# Patient Record
Sex: Male | Born: 1965 | Race: White | Hispanic: No | Marital: Married | Smoking: Current every day smoker
Health system: Southern US, Community
[De-identification: ages and names within clinical notes are randomized; demographics above are authoritative.]

## PROBLEM LIST (undated history)

## (undated) DIAGNOSIS — F329 Major depressive disorder, single episode, unspecified: Secondary | ICD-10-CM

## (undated) DIAGNOSIS — F32A Depression, unspecified: Secondary | ICD-10-CM

## (undated) DIAGNOSIS — G4730 Sleep apnea, unspecified: Secondary | ICD-10-CM

## (undated) HISTORY — DX: Depression, unspecified: F32.A

## (undated) HISTORY — PX: WISDOM TOOTH EXTRACTION: SHX21

## (undated) HISTORY — DX: Major depressive disorder, single episode, unspecified: F32.9

## (undated) HISTORY — PX: TONSILLECTOMY: SUR1361

## (undated) HISTORY — DX: Sleep apnea, unspecified: G47.30

---

## 1997-11-21 ENCOUNTER — Emergency Department (HOSPITAL_COMMUNITY): Admission: EM | Admit: 1997-11-21 | Discharge: 1997-11-21 | Payer: Self-pay | Admitting: Emergency Medicine

## 1998-01-01 ENCOUNTER — Encounter: Admission: RE | Admit: 1998-01-01 | Discharge: 1998-04-01 | Payer: Self-pay | Admitting: Specialist

## 1998-04-08 ENCOUNTER — Encounter: Admission: RE | Admit: 1998-04-08 | Discharge: 1998-07-07 | Payer: Self-pay | Admitting: Specialist

## 1998-10-14 ENCOUNTER — Ambulatory Visit: Admission: RE | Admit: 1998-10-14 | Discharge: 1998-10-14 | Payer: Self-pay | Admitting: *Deleted

## 2004-04-28 ENCOUNTER — Ambulatory Visit: Payer: Self-pay | Admitting: Pulmonary Disease

## 2005-12-22 ENCOUNTER — Encounter: Payer: Self-pay | Admitting: Gastroenterology

## 2009-11-26 ENCOUNTER — Encounter (INDEPENDENT_AMBULATORY_CARE_PROVIDER_SITE_OTHER): Payer: Self-pay | Admitting: *Deleted

## 2009-12-07 ENCOUNTER — Ambulatory Visit: Payer: Self-pay | Admitting: Gastroenterology

## 2009-12-07 DIAGNOSIS — Z860100 Personal history of colon polyps, unspecified: Secondary | ICD-10-CM | POA: Insufficient documentation

## 2009-12-07 DIAGNOSIS — G4733 Obstructive sleep apnea (adult) (pediatric): Secondary | ICD-10-CM | POA: Insufficient documentation

## 2010-01-07 ENCOUNTER — Ambulatory Visit (HOSPITAL_COMMUNITY): Admission: RE | Admit: 2010-01-07 | Discharge: 2010-01-07 | Payer: Self-pay | Admitting: Gastroenterology

## 2010-01-07 ENCOUNTER — Ambulatory Visit: Payer: Self-pay | Admitting: Gastroenterology

## 2010-02-21 ENCOUNTER — Ambulatory Visit: Payer: Self-pay | Admitting: Gastroenterology

## 2010-02-22 ENCOUNTER — Telehealth: Payer: Self-pay | Admitting: Gastroenterology

## 2010-05-31 NOTE — Assessment & Plan Note (Signed)
Summary: UNCONTROLLED LOOSE STOOLS WITH BLOOD...AS.   History of Present Illness Visit Type: new patient  Primary GI MD: Melvia Heaps MD [facility] Primary Provider: Benedetto Goad, MD  Requesting Provider: na Chief Complaint: Bloating, BRB in stool and when patient wipes after BMs, rectal pain, and hemorrhoids  History of Present Illness:   Mr. Stephen Sims is a pleasant 45 year old white male referred at the request of Dr. Andrey Campanile for evaluation of rectal bleeding.  For years he has had spontaneous rectal bleeding staining his his underclothes and bleeding associated with bowel movements.  He has had rectal soreness at times.  He apparantly underwent colonoscopy 2 years ago. No polyps were seen and hemorrhoids were identified.  He has taken various topical hemorrhoid medications and suppositories.  Hemorrhoids  will occasionally protrude with a bowel movement.  He moves his bowels regularly.   GI Review of Systems    Reports bloating.      Denies abdominal pain, acid reflux, belching, chest pain, dysphagia with liquids, dysphagia with solids, heartburn, loss of appetite, nausea, vomiting, vomiting blood, weight loss, and  weight gain.      Reports hemorrhoids, rectal bleeding, and  rectal pain.     Denies anal fissure, black tarry stools, change in bowel habit, constipation, diarrhea, diverticulosis, fecal incontinence, heme positive stool, irritable bowel syndrome, jaundice, light color stool, and  liver problems.    Current Medications (verified): 1)  Effexor Xr 150 Mg Xr24h-Cap (Venlafaxine Hcl) .... One Capsule By Mouth Once Daily 2)  Goodys Body Pain 500-325 Mg Pack (Aspirin-Acetaminophen) .... As Needed  Allergies (verified): 1)  ! Pcn 2)  ! Erythromycin 3)  ! Codeine  Past History:  Past Medical History: sleep apnea Anxiety Disorder Arthritis Colon Polyps Depression  Past Surgical History: Reviewed history from 12/02/2009 and no changes required. 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  Alcohol Use - yes: 2 beers daily  Daily Caffeine Use: 3 cups of coffee  Illicit Drug Use - no Smoking Status:  current Drug Use:  no  Review of Systems       The patient complains of anxiety-new, change in vision, depression-new, and fatigue.  The patient denies allergy/sinus, anemia, arthritis/joint pain, back pain, blood in urine, breast changes/lumps, confusion, cough, coughing up blood, fainting, fever, headaches-new, hearing problems, heart murmur, heart rhythm changes, itching, menstrual pain, muscle pains/cramps, night sweats, nosebleeds, pregnancy symptoms, shortness of breath, skin rash, sleeping problems, sore throat, swelling of feet/legs, swollen lymph glands, thirst - excessive , urination - excessive , urination changes/pain, urine leakage, vision changes, and voice change.         All other systems were reviewed and were negative   Vital Signs:  Patient profile:   45 year old male Height:      66 inches Weight:      183 pounds BMI:     29.64 BSA:     1.93 Pulse rate:   64 / minute Pulse rhythm:   regular BP sitting:   122 / 76  (left arm) Cuff size:   regular  Vitals Entered By: Ok Anis CMA (December 07, 2009 11:23 AM)  Physical Exam  Additional Exam:  On physical exam he is a well-developed well-nourished male  skin: anicteric HEENT: normocephalic; PEERLA; no nasal or pharyngeal abnormalities  neck: supple nodes: no cervical lymphadenopathy chest: clear to ausculatation and percussion heart: no murmurs, gallops, or rubs abd: soft, nontender; BS normoactive; no abdominal masses, tenderness, organomegaly rectal: no masses ext: no cynanosis, clubbing, edema skeletal: no deformities neuro: oriented x 3; no focal  abnormalities    Impression & Recommendations:  Problem # 1:  INTERNAL HEMORRHOIDS WITHOUT MENTION COMP (ICD-455.0)  Pt has grade 3 hemorrhoids.  Recommendations #1 I discussed various options including surgical hemorrhoidectomy and band  ligation.  At this time the patient wishes to try the latter.  Risks, alternatives, and complications of the procedure, including bleeding, perforation, and possible need for surgery, were explained to the patient.  Patient's questions were answered.  Orders: ZCOL Banding (ZCOL Band)  Problem # 2:  PERSONAL HISTORY OF COLONIC POLYPS (ICD-V12.72)  I will review his prior records.  Orders: ZCOL Banding (ZCOL Band)  Problem # 3:  HEMORRHAGE OF RECTUM AND ANUS (ICD-569.3)  Tthis most likely is due to  hemorrhoids.  At the patient's request I will do a full colonoscopy to rule out recurrent polyps and a more proximal colonic bleeding source.  Orders: ZCOL Banding (ZCOL Band)  Patient Instructions: 1)  Copy sent to : Benedetto Goad, MD  2)  Colonoscopy and Flexible Sigmoidoscopy brochure given.  3)  Conscious Sedation brochure given.  4)  Hemorrhoids brochure given.  5)  Your colonoscopy is scheduled at [facility] on 01/07/2010 at 12:30pm 6)  You can pick up your MoviPrep from your pharmacy today 7)  The medication list was reviewed and reconciled.  All changed / newly prescribed medications were explained.  A complete medication list was provided to the patient / caregiver. Prescriptions: MOVIPREP 100 GM  SOLR (PEG-KCL-NACL-NASULF-NA ASC-C) As per prep instructions.  #1 x 0   Entered by:   Merri Ray CMA (AAMA)   Authorized by:   Louis Meckel MD   Signed by:   Merri Ray CMA (AAMA) on 12/07/2009   Method used:   Electronically to        [facility] (retail)       [address]       [place], [place]  [address]       Ph: [phone]       Fax: [phone]   RxID:   [id number]

## 2010-05-31 NOTE — Procedures (Signed)
Summary: Instructions for procedure/[place]  Instructions for procedure/[place]   Imported By: Sherian Rein 12/10/2009 09:56:26  _____________________________________________________________________  External Attachment:    Type:   Image     Comment:   External Document

## 2010-05-31 NOTE — Procedures (Signed)
Summary: Colonoscopy/[facility]  Colonoscopy/[facility]   Imported By: Sherian Rein 03/04/2010 09:18:39  _____________________________________________________________________  External Attachment:    Type:   Image     Comment:   External Document

## 2010-05-31 NOTE — Assessment & Plan Note (Signed)
Summary: PROCEDURE F/U.Marland KitchenMarland KitchenAS.   History of Present Illness Visit Type: Follow-up Visit Primary GI MD: Melvia Heaps MD [facility] Primary Pranathi Winfree: Benedetto Goad, MD  Requesting Sholom Dulude: na Chief Complaint: F/u from flex sig.  Pt states that he is much better and denies any GI complaints  History of Present Illness:   Mr. Holtman has returned following band ligation of his hemorrhoids.  He reports complete resolution of his hemorrhoidal symptoms including leakage, discomfort and bleeding.  He currently has no GI complaints.   GI Review of Systems      Denies abdominal pain, acid reflux, belching, bloating, chest pain, dysphagia with liquids, dysphagia with solids, heartburn, loss of appetite, nausea, vomiting, vomiting blood, weight loss, and  weight gain.        Denies anal fissure, black tarry stools, change in bowel habit, constipation, diarrhea, diverticulosis, fecal incontinence, heme positive stool, hemorrhoids, irritable bowel syndrome, jaundice, light color stool, liver problems, rectal bleeding, and  rectal pain.    Current Medications (verified): 1)  Effexor Xr 150 Mg Xr24h-Cap (Venlafaxine Hcl) .... One Capsule By Mouth Once Daily 2)  Goodys Body Pain 500-325 Mg Pack (Aspirin-Acetaminophen) .... As Needed  Allergies (verified): 1)  ! Pcn 2)  ! Erythromycin 3)  ! Codeine  Past History:  Past Medical History: sleep apnea Anxiety Disorder Arthritis Colon Polyps Depression Hemorrhoids--banding   Past Surgical History: Reviewed history from 12/02/2009 and no changes required. Tonsillectomy  Family History: Reviewed history from 12/07/2009 and no changes required. 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  Alcohol Use - yes: 2  beers daily  Daily Caffeine Use: 3 cups of coffee  Illicit Drug Use - no  Review of Systems  The patient denies allergy/sinus, anemia, anxiety-new, arthritis/joint pain, back pain, blood in urine, breast changes/lumps, change in vision, confusion, cough, coughing up blood, depression-new, fainting, fatigue, fever, headaches-new, hearing problems, heart murmur, heart rhythm changes, itching, menstrual pain, muscle pains/cramps, night sweats, nosebleeds, pregnancy symptoms, shortness of breath, skin rash, sleeping problems, sore throat, swelling of feet/legs, swollen lymph glands, thirst - excessive , urination - excessive , urination changes/pain, urine leakage, vision changes, and voice change.    Vital Signs:  Patient profile:   45 year old male Height:      66 inches Weight:      182 pounds BMI:     29.48 BSA:     1.92 Pulse rate:   64 / minute Pulse rhythm:   regular BP sitting:   124 / 76  (left arm) Cuff size:   regular  Vitals Entered By: Ok Anis CMA (February 21, 2010 9:24 AM)   Impression & Recommendations:  Problem # 1:  INTERNAL HEMORRHOIDS WITHOUT MENTION COMP (ICD-455.0) Assessment Improved status post band ligation.  Problem # 2:  PERSONAL HISTORY OF COLONIC POLYPS (ICD-V12.72)  I will review his prior records.  Problem # 3:  HEMORRHAGE OF RECTUM AND ANUS (ICD-569.3) Assessment: Improved  Patient Instructions: 1)  Copy sent to : Benedetto Goad, MD  2)  The medication list was reviewed and reconciled.  All changed / newly prescribed medications were explained.  A complete medication list was provided to the patient / caregiver.

## 2010-05-31 NOTE — Letter (Signed)
Summary: [facility] Instructions  [facility]  [address] [place], [place] [address]   Phone: [phone]  Fax: [phone]       Stephen Sims    [birth year]    MRN: [id number]        Procedure Day /Date:FRIDAY 01/07/2010     Arrival Time:11:30AM     Procedure Time:12:30PM     Location of Procedure:                     X  [facility] ( Outpatient Registration)                        PREPARATION FOR COLONOSCOPY WITH MOVIPREP   Starting 5 days prior to your procedure 01/02/2010 do not eat nuts, seeds, popcorn, corn, beans, peas,  salads, or any raw vegetables.  Do not take any fiber supplements (e.g. Metamucil, Citrucel, and Benefiber).  THE DAY BEFORE YOUR PROCEDURE         DATE: 01/07/2010  DAY: FRIDAY   1.  Drink clear liquids the entire day-NO SOLID FOOD  2.  Do not drink anything colored red or purple.  Avoid juices with pulp.  No orange juice.  3.  Drink at least 64 oz. (8 glasses) of fluid/clear liquids during the day to prevent dehydration and help the prep work efficiently.  CLEAR LIQUIDS INCLUDE: Water Jello Ice Popsicles Tea (sugar ok, no milk/cream) Powdered fruit flavored drinks Coffee (sugar ok, no milk/cream) Gatorade Juice: apple, white grape, white cranberry  Lemonade Clear bullion, consomm, broth Carbonated beverages (any kind) Strained chicken noodle soup Hard Candy                             4.  In the morning, mix first dose of MoviPrep solution:    Empty 1 Pouch A and 1 Pouch B into the disposable container    Add lukewarm drinking water to the top line of the container. Mix to dissolve    Refrigerate (mixed solution should be used within 24 hrs)  5.  Begin drinking the prep at 5:00 p.m. The MoviPrep container is divided by 4 marks.   Every 15 minutes drink the solution down to the next Ganon (approximately 8 oz) until the full liter is complete.   6.  Follow completed prep with 16 oz of clear liquid of your choice  (Nothing red or purple).  Continue to drink clear liquids until bedtime.  7.  Before going to bed, mix second dose of MoviPrep solution:    Empty 1 Pouch A and 1 Pouch B into the disposable container    Add lukewarm drinking water to the top line of the container. Mix to dissolve    Refrigerate  THE DAY OF YOUR PROCEDURE      DATE: 01/07/2010 DAY: FRIDAY  Beginning at 7:30a.m. (5 hours before procedure):         1. Every 15 minutes, drink the solution down to the next Kharson (approx 8 oz) until the full liter is complete.  2. Follow completed prep with 16 oz. of clear liquid of your choice.    3. You may drink clear liquids until 8:30AM(4  HOURS BEFORE PROCEDURE).   MEDICATION INSTRUCTIONS  Unless otherwise instructed, you should take regular prescription medications with a small sip of water   as early as possible the morning of your procedure.  OTHER INSTRUCTIONS  You will need a responsible adult at least 47 years of age to accompany you and drive you home.   This person must remain in the waiting room during your procedure.  Wear loose fitting clothing that is easily removed.  Leave jewelry and other valuables at home.  However, you may wish to bring a book to read or  an iPod/MP3 player to listen to music as you wait for your procedure to start.  Remove all body piercing jewelry and leave at home.  Total time from sign-in until discharge is approximately 2-3 hours.  You should go home directly after your procedure and rest.  You can resume normal activities the  day after your procedure.  The day of your procedure you should not:   Drive   Make legal decisions   Operate machinery   Drink alcohol   Return to work  You will receive specific instructions about eating, activities and medications before you leave.    The above instructions have been reviewed and explained to me by   _______________________    I fully understand and can verbalize  these instructions _____________________________ Date _________

## 2010-05-31 NOTE — Letter (Signed)
Summary: New Patient letter  [facility]  [address] [place], [place] [address]   Phone: [phone]  Fax: [phone]       11/26/2009 MRN: [id number]  [name] [address] [name], [place]  [address]  Dear Stephen Sims,  Welcome to the Gastroenterology Division at [facility].    You are scheduled to see Dr. Arlyce Dice on 12/07/2009 at 11:00AM on the 3rd floor at [facility], [address]  We ask that you try to arrive at our office 15 minutes prior to your appointment time to allow for check-in.  We would like you to complete the enclosed self-administered evaluation form prior to your visit and bring it with you on the day of your appointment.  We will review it with you.  Also, please bring a complete list of all your medications or, if you prefer, bring the medication bottles and we will list them.  Please bring your insurance card so that we may make a copy of it.  If your insurance requires a referral to see a specialist, please bring your referral form from your primary care physician.  Co-payments are due at the time of your visit and may be paid by cash, check or credit card.     Your office visit will consist of a consult with your physician (includes a physical exam), any laboratory testing he/she may order, scheduling of any necessary diagnostic testing (e.g. x-ray, ultrasound, CT-scan), and scheduling of a procedure (e.g. Endoscopy, Colonoscopy) if required.  Please allow enough time on your schedule to allow for any/all of these possibilities.    If you cannot keep your appointment, please call [phone] to cancel or reschedule prior to your appointment date.  This allows [place] the opportunity to schedule an appointment for another patient in need of care.  If you do not cancel or reschedule by 5 p.m. the business day prior to your appointment date, you will be charged a $50.00 late cancellation/no-show fee.    Thank you for choosing  [facility] for your medical needs.  We appreciate the opportunity to care for you.  Please visit [place] at our website  to learn more about our practice.                     Sincerely,                                                             The Gastroenterology Division

## 2010-05-31 NOTE — Procedures (Signed)
Summary: Flexible Sigmoidoscopy  Patient: [name] Note: All result statuses are Final unless otherwise noted.  Tests: (1) Flexible Sigmoidoscopy (FLX)  FLX Flexible Sigmoidoscopy                             DONE     [facility]     [address] [place], [place]  [address]           FLEXIBLE SIGMOIDOSCOPY PROCEDURE REPORT           PATIENT:  Stephen Sims, Stephen Sims  MR#:  [id number]     BIRTHDATE:  02-Aug-1965, 44 yrs. old  GENDER:  male           ENDOSCOPIST:  Barbette Hair. Arlyce Dice, MD     Referred by:           PROCEDURE DATE:  01/07/2010     PROCEDURE:  Flexible Sigmoidoscopy with banding     ASA CLASS:  Class II     INDICATIONS:  treatment of hemorrhoids           MEDICATIONS:   Fentanyl 100 mcg IV, Versed 10 mg IV, Benadryl 50     mg IV           DESCRIPTION OF PROCEDURE:   After the risks benefits and     alternatives of the procedure were thoroughly explained, informed     consent was obtained.  Digital rectal exam was performed and     revealed no abnormalities.   The  endoscope was introduced through     the anus and advanced to the sigmoid colon, without limitations.     The quality of the prep was .  The instrument was then slowly     withdrawn as the mucosa was fully examined.           Internal hemorrhoids were found. hemorrhoidal banding 7 bands were     placed circumferentially just above the dentate line over 3     hemorrhoidal bundles   Retroflexion was not performed.  The scope     was then withdrawn from the patient and the procedure terminated.           COMPLICATIONS:  None           ENDOSCOPIC IMPRESSION:     1) Internal hemorrhoids - s/p band ligation     RECOMMENDATIONS:     1) Call the office to schedule a followup office visit for     _3_weeks           REPEAT EXAM:  No           ______________________________     Barbette Hair. Arlyce Dice, MD           CC:  Benedetto Goad, MD           n.     Rosalie Doctor:   Barbette Hair. Sunshine Mackowski at 01/07/2010 01:07  PM           Lehew, Anush, [place] [phone]  Note: An exclamation Naif (!) indicates a result that was not dispersed into the flowsheet. Document Creation Date: 01/07/2010 1:07 PM _______________________________________________________________________  (1) Order result status: Final Collection or observation date-time: 01/07/2010 13:04 Requested date-time:  Receipt date-time:  Reported date-time:  Referring Physician:   Ordering Physician: Melvia Heaps [phone]) Specimen Source:  Source: Launa Grill Order Number: [id number] Lab site:

## 2010-05-31 NOTE — Progress Notes (Signed)
Summary: Previous records  ---- Converted from flag ---- ---- 02/22/2010 2:25 PM, Louis Meckel MD wrote: needs colo 2017  ---- 02/22/2010 2:06 PM, Merri Ray CMA Duncan Dull) wrote: Elvina Sidle, Dr Julien Nordmann office found Marks colon report. I will put in your folder.. He had hyperplastic polyps in 11/2005. Repeat colon in 10 years.  ---- 02/22/2010 8:14 AM, Louis Meckel MD wrote: Ask pt how long ago his previous colo was.  If greater than 5 years he should have a repeat.  ---- 02/21/2010 4:52 PM, Merri Ray CMA (AAMA) wrote: I Contacted Dr Julien Nordmann office to get records concerning colon polyps. They stated they do not have any reports but what we have sent them.Which is the Flex. I tried to contact pt but phone number we have on file is not in service.  ---- 02/21/2010 9:50 AM, Louis Meckel MD wrote: please check Dr. Tawana Scale office for records regarding colon polyps in this patient. ------------------------------ PUT REMINDER RECALL COLONOSCOPY IN IDX FOR 11/30/2015  Appended Document: Previous records    Clinical Lists Changes  Observations: Added new observation of COLONNXTDUE: 11/30/2015 (02/22/2010 14:35)

## 2010-05-31 NOTE — Letter (Signed)
Summary: Results Letter  [facility]  [address] [place], [place] [address]   Phone: [phone]  Fax: [phone]        December 07, 2009 MRN: [id number]    [facility] [address] [place], [place]  [address]    Dear Mr. Rueth,  It is my pleasure to have treated you recently as a new patient in my office. I appreciate your confidence and the opportunity to participate in your care.  Since I do have a busy inpatient endoscopy schedule and office schedule, my office hours vary weekly. I am, however, available for emergency calls everyday through my office. If I am not available for an urgent office appointment, another one of our gastroenterologist will be able to assist you.  My well-trained staff are prepared to help you at all times. For emergencies after office hours, a physician from our Gastroenterology section is always available through my 24 hour answering service  Once again I welcome you as a new patient and I look forward to a happy and healthy relationship            Sincerely,  Louis Meckel MD  This letter has been electronically signed by your physician.  Appended Document: Results Letter LETTER MAILED

## 2013-02-17 ENCOUNTER — Ambulatory Visit: Payer: Self-pay | Admitting: Pulmonary Disease

## 2013-04-25 ENCOUNTER — Encounter: Payer: Self-pay | Admitting: Pulmonary Disease

## 2013-04-25 ENCOUNTER — Ambulatory Visit (INDEPENDENT_AMBULATORY_CARE_PROVIDER_SITE_OTHER): Payer: Managed Care, Other (non HMO) | Admitting: Pulmonary Disease

## 2013-04-25 ENCOUNTER — Encounter (INDEPENDENT_AMBULATORY_CARE_PROVIDER_SITE_OTHER): Payer: Self-pay

## 2013-04-25 VITALS — BP 130/82 | HR 77 | Temp 98.1°F | Ht 66.0 in | Wt 203.0 lb

## 2013-04-25 DIAGNOSIS — G4730 Sleep apnea, unspecified: Secondary | ICD-10-CM

## 2013-04-25 NOTE — Assessment & Plan Note (Signed)
The patient has a history of sleep apnea in the past, and has not been seen since 2005. He has not had a new machine since that time, and now is having issues with the machine making noise and having difficulties turning on.  He is also having breakthrough snoring and nonrestorative sleep, along with mild sleepiness during the day with inactivity. At this point, he is obviously going to need a new machine and mask, and will also take this opportunity to optimize his pressure again. I've encouraged him to work aggressively on weight loss, and he will need to followup with me on a yearly basis.

## 2013-04-25 NOTE — Progress Notes (Signed)
Subjective:    Patient ID: Stephen Sims, male    DOB: 1965-11-08, 47 y.o.   MRN: [id number]  HPI The patient is a 47 year old male who comes in today to reestablish for management of obstructive sleep apnea. He was diagnosed many years ago with sleep apnea, and has been on CPAP since that time. However, he has not been seen since 2005, and is using an aged CPAP machine and an old mask that is requiring duct tape to hold together. His machine is making noises, and is difficult to turn on at times. He is having breakthrough snoring according to his bed partner, and he is not rested in the mornings upon arising. He notes definite daytime sleepiness with inactivity. His Epworth score today is 14, and his weight is up significantly since his last visit.   Sleep Questionnaire What time do you typically go to bed?( Between what hours) 11p-1130p 11p-1130p at 0910 on 04/25/13 by Maisie Fus, CMA How long does it take you to fall asleep? 10-17mins 10-41mins at 0910 on 04/25/13 by Maisie Fus, CMA How many times during the night do you wake up? 0 0 at 0910 on 04/25/13 by Maisie Fus, CMA What time do you get out of bed to start your day? 0600 0600 at 0910 on 04/25/13 by Maisie Fus, CMA Do you drive or operate heavy machinery in your occupation? [employer] at [phone] on 04/25/13 by Maisie Fus, CMA How much has your weight changed (up or down) over the past two years? (In pounds) 8 lb (3.629 kg) 8 lb (3.629 kg) at 0910 on 04/25/13 by Maisie Fus, CMA Have you ever had a sleep study before? Yes Yes at 0910 on 04/25/13 by Maisie Fus, CMA If yes, [facility] 0910 on 04/25/13 by Maisie Fus, CMA If yes, date of study? 2005 2005 at 0910 on 04/25/13 by Maisie Fus, CMA Do you currently use CPAP? Yes Yes at 0910 on 04/25/13 by Maisie Fus, CMA If so, what pressure? 12? 12? at 0910 on 04/25/13 by Maisie Fus, CMA Do you wear  oxygen at any time? No    Review of Systems  Constitutional: Negative for fever and unexpected weight change.  HENT: Negative for congestion, dental problem, ear pain, nosebleeds, postnasal drip, rhinorrhea, sinus pressure, sneezing, sore throat and trouble swallowing.   Eyes: Negative for redness and itching.  Respiratory: Positive for shortness of breath. Negative for cough, chest tightness and wheezing.   Cardiovascular: Negative for palpitations and leg swelling.  Gastrointestinal: Negative for nausea and vomiting.  Genitourinary: Negative for dysuria.  Musculoskeletal: Negative for joint swelling.  Skin: Negative for rash.  Neurological: Negative for headaches.  Hematological: Does not bruise/bleed easily.  Psychiatric/Behavioral: Positive for dysphoric mood. The patient is not nervous/anxious.        Objective:   Physical Exam Constitutional:  Well developed, no acute distress  HENT:  Nares patent without discharge  Oropharynx without exudate, palate and uvula are normal  Eyes:  Perrla, eomi, no scleral icterus  Neck:  No JVD, no TMG  Cardiovascular:  Normal rate, regular rhythm, no rubs or gallops.  No murmurs        Intact distal pulses  Pulmonary :  Normal breath sounds, no stridor or respiratory distress   No rales, rhonchi, or wheezing  Abdominal:  Soft, nondistended, bowel sounds present.  No tenderness noted.   Musculoskeletal:  No  lower extremity edema noted.  Lymph Nodes:  No cervical lymphadenopathy noted  Skin:  No cyanosis noted  Neurologic:  Alert, appropriate, moves all 4 extremities without obvious deficit.         Assessment & Plan:

## 2013-04-25 NOTE — Patient Instructions (Signed)
Will get you a new cpap machine and mask, and will use the auto setting to re-calibrate your pressure.  Will let you know the results once we receive your download in 3-4 weeks. Work on weight loss followup with me in one year if doing well, but call if having issues.

## 2013-04-28 ENCOUNTER — Institutional Professional Consult (permissible substitution): Payer: Self-pay | Admitting: Pulmonary Disease

## 2014-04-27 ENCOUNTER — Ambulatory Visit: Payer: Self-pay | Admitting: Pulmonary Disease

## 2014-05-04 ENCOUNTER — Ambulatory Visit: Payer: Self-pay | Admitting: Pulmonary Disease

## 2015-08-18 ENCOUNTER — Ambulatory Visit (INDEPENDENT_AMBULATORY_CARE_PROVIDER_SITE_OTHER): Payer: 59 | Admitting: Pulmonary Disease

## 2015-08-18 ENCOUNTER — Encounter: Payer: Self-pay | Admitting: Pulmonary Disease

## 2015-08-18 ENCOUNTER — Ambulatory Visit (INDEPENDENT_AMBULATORY_CARE_PROVIDER_SITE_OTHER)
Admission: RE | Admit: 2015-08-18 | Discharge: 2015-08-18 | Disposition: A | Discharge status: Home / Self Care | Payer: 59 | Source: Ambulatory Visit | Attending: Pulmonary Disease | Admitting: Pulmonary Disease

## 2015-08-18 VITALS — BP 128/88 | HR 67 | Ht 66.0 in | Wt 208.0 lb

## 2015-08-18 DIAGNOSIS — R0609 Other forms of dyspnea: Secondary | ICD-10-CM | POA: Diagnosis not present

## 2015-08-18 DIAGNOSIS — Z720 Tobacco use: Secondary | ICD-10-CM | POA: Insufficient documentation

## 2015-08-18 DIAGNOSIS — R0600 Dyspnea, unspecified: Secondary | ICD-10-CM

## 2015-08-18 DIAGNOSIS — G4733 Obstructive sleep apnea (adult) (pediatric): Secondary | ICD-10-CM

## 2015-08-18 NOTE — Assessment & Plan Note (Signed)
Advised on smoking cessation. 

## 2015-08-18 NOTE — Assessment & Plan Note (Signed)
Pt with OSA. Unknown severity. Historically, CPAP has been doing well. Auto CPAP 5-20 cm water. Download the last month, 100% compliance, AHI 1.2. Has gained 30 pounds 6 years. Has a nasal mask. Has hypersomnia, snoring, dry mouth despite using CPAP. The issue is he breathes through his mouth with a nasal mask. Plan : 1. Switch to full face mask. If that doesn't work, patient will call and may need to get an ABG. Need to rule out obesity hypoventilation syndrome. Another option is modafinil but not sure if insurance will cover. 2. Sleep hygiene. 3. Cont  CPAP usage.

## 2015-08-18 NOTE — Progress Notes (Signed)
Subjective:    Patient ID: Stephen GainerMark A Sims, male    DOB: 01/23/1966, 50 y.o.   MRN: [id number]  HPI ROV (08/18/15) Pt returns to office as fd/u on his osa.  He uses his cpap. Feels better using it. More energy. Less sleepiness. Through the years, he feels cpap machine is not working as well as when he got it 2-3 yrs ago. Has snoring, hypersomnia. Hypersomnia affects fxnality. Sleepiness affects work. Has gained 5 lbs x 3 yrs and 30 lbs x 6 yrs. DL the last month 409%100%, AHI 1.2  Last seen in 03/2013. Pt has a nasla mask -- wakes up with dry mouth and has snoring. No new medical issues since last seen. trying to quit smoking.   Review of Systems  Constitutional: Negative.   HENT: Negative.   Eyes: Negative.   Respiratory: Positive for shortness of breath.   Cardiovascular: Negative.   Gastrointestinal: Negative.   Endocrine: Negative.   Genitourinary: Negative.   Musculoskeletal: Negative.   Skin: Negative.   Allergic/Immunologic: Negative.   Neurological: Negative.   Hematological: Negative.   Psychiatric/Behavioral: Negative.   All other systems reviewed and are negative.   Past Medical History  Diagnosis Date  . Sleep apnea   . Depression      Family History  Problem Relation Age of Onset  . Emphysema Father   . Heart disease Father   . COPD Father   . Atrial fibrillation Father   . Atrial fibrillation Mother      Past Surgical History  Procedure Laterality Date  . Tonsillectomy    . Wisdom tooth extraction      Social History   Social History  . Marital Status: Married    Spouse Name: N/A  . Number of Children: N/A  . Years of Education: N/A   Occupational History  . Supervisor (Maintenance)    Social History Main Topics  . Smoking status: Current Every Day Smoker -- 0.25 packs/day for 35 years    Types: Cigarettes  . Smokeless tobacco: Not on file  . Alcohol Use: 0.0 oz/week    0 Standard drinks or equivalent per week     Comment: 6pk per month  .  Drug Use: No  . Sexual Activity: Not on file   Other Topics Concern  . Not on file   Social History Narrative     Allergies  Allergen Reactions  . Codeine   . Erythromycin   . Penicillins      Outpatient Prescriptions Prior to Visit  Medication Sig Dispense Refill  . venlafaxine (EFFEXOR) 100 MG tablet Take 1 tablet by mouth daily.     No facility-administered medications prior to visit.   Meds ordered this encounter  Medications  . nicotine (NICODERM CQ - DOSED IN MG/24 HOURS) 21 mg/24hr patch    Sig: Place 21 mg onto the skin daily.           Objective:   Physical Exam  Vitals:  Filed Vitals:   08/18/15 1620  BP: 128/88  Pulse: 67  Height: 5\' 6"  (1.676 m)  Weight: 208 lb (94.348 kg)  SpO2: 98%    Constitutional/General:  Pleasant, well-nourished, well-developed, not in any distress,  Comfortably seating.  Well kempt obese.   Body mass index is 33.59 kg/(m^2). Wt Readings from Last 3 Encounters:  08/18/15 208 lb (94.348 kg)  04/25/13 203 lb (92.08 kg)  02/21/10 182 lb (82.555 kg)    Neck circumference: 19 inches  HEENT: Pupils  equal and reactive to light and accommodation. Anicteric sclerae. Normal nasal mucosa.   No oral  lesions,  mouth clear,  oropharynx clear, no postnasal drip. (-) Oral thrush. No dental caries.  Airway - Mallampati class III  Neck: No masses. Midline trachea. No JVD, (-) LAD. (-) bruits appreciated.  Respiratory/Chest: Grossly normal chest. (-) deformity. (-) Accessory muscle use.  Symmetric expansion. (-) Tenderness on palpation.  Resonant on percussion.  Diminished BS on both lower lung zones. (-) wheezing,rhonchi  Some crackles at bases.  (-) egophony  Cardiovascular: Regular rate and  rhythm, heart sounds normal, no murmur or gallops, no peripheral edema  Gastrointestinal:  Normal bowel sounds. Soft, non-tender. No hepatosplenomegaly.  (-) masses.   Musculoskeletal:  Normal muscle tone. Normal gait.    Extremities: Grossly normal. (-) clubbing, cyanosis.  (-) edema  Skin: (-) rash,lesions seen.   Neurological/Psychiatric : alert, oriented to time, place, person. Normal mood and affect            Assessment & Plan:  OSA (obstructive sleep apnea) Pt with OSA. Unknown severity. Historically, CPAP has been doing well. Auto CPAP 5-20 cm water. Download the last month, 100% compliance, AHI 1.2. Has gained 30 pounds 6 years. Has a nasal mask. Has hypersomnia, snoring, dry mouth despite using CPAP. The issue is he breathes through his mouth with a nasal mask. Plan : 1. Switch to full face mask. If that doesn't work, patient will call and may need to get an ABG. Need to rule out obesity hypoventilation syndrome. Another option is modafinil but not sure if insurance will cover. 2. Sleep hygiene. 3. Cont  CPAP usage.  Tobacco user Advised on smoking cessation.  Exertional dyspnea Smokes one pack per day. Likely has COPD. Likely mild. Both parents had COPD. Gets SOB, not too bad for now. Plan for chest x-ray today. We'll need PFTs once more symptomatic. Patient will call if dyspnea is worse.    Return to clinic in 1 yr.  [name], MD 08/18/2015, 4:50 PM [facility] Pager [phone] After 3 pm or if no answer, call [phone]

## 2015-08-18 NOTE — Assessment & Plan Note (Signed)
Smokes one pack per day. Likely has COPD. Likely mild. Both parents had COPD. Gets SOB, not too bad for now. Plan for chest x-ray today. We'll need PFTs once more symptomatic. Patient will call if dyspnea is worse.

## 2015-08-18 NOTE — Patient Instructions (Signed)
1. We will order you a full face mask. If that does not work, pls let us know. We will order abg then. 2. We will get you a chest X ray. 3. Let us know if you have more SOB.   Return to clinic in 1 yr

## 2015-09-06 ENCOUNTER — Encounter: Payer: Self-pay | Admitting: Pulmonary Disease

## 2015-11-29 ENCOUNTER — Encounter: Payer: Self-pay | Admitting: Gastroenterology

## 2017-03-11 IMAGING — DX DG CHEST 2V
2 series · 2 of 2 positions shown · non-contrast
Comparison: None.

CLINICAL DATA: Dyspnea on exertion.

EXAM:
CHEST  2 VIEW

[chest pa]
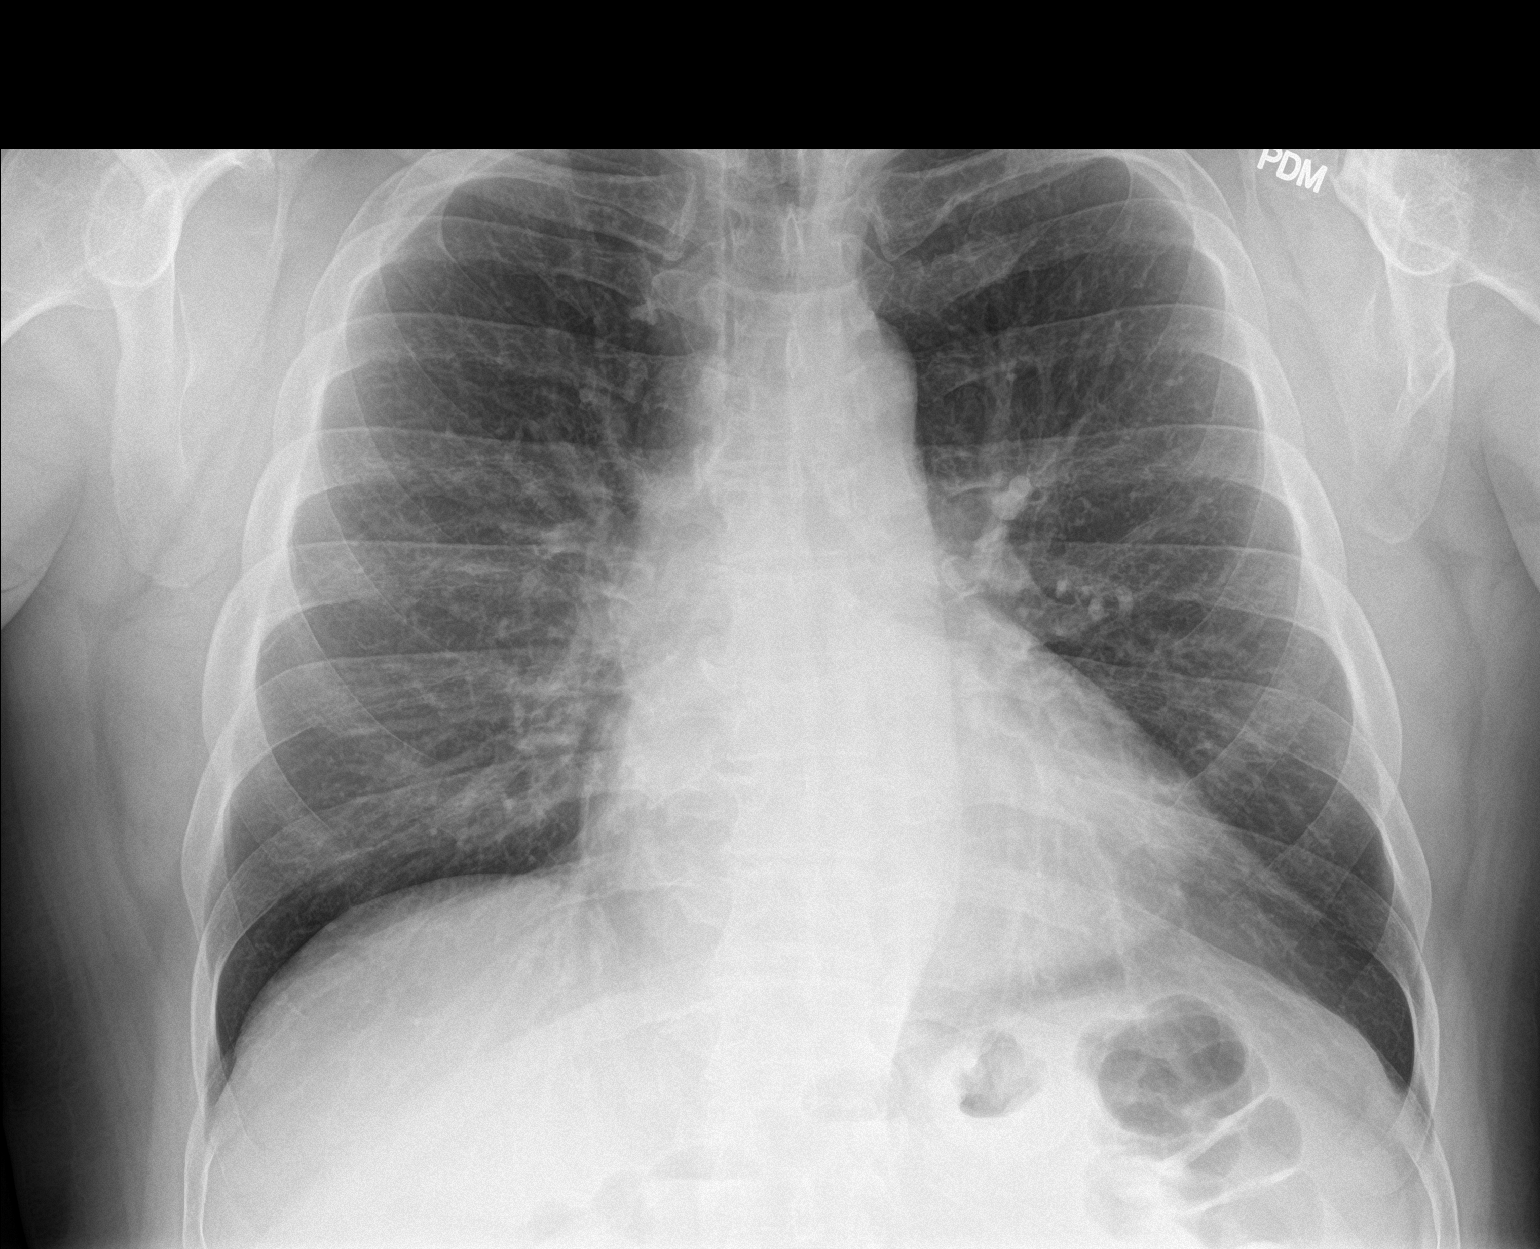

[chest lat]
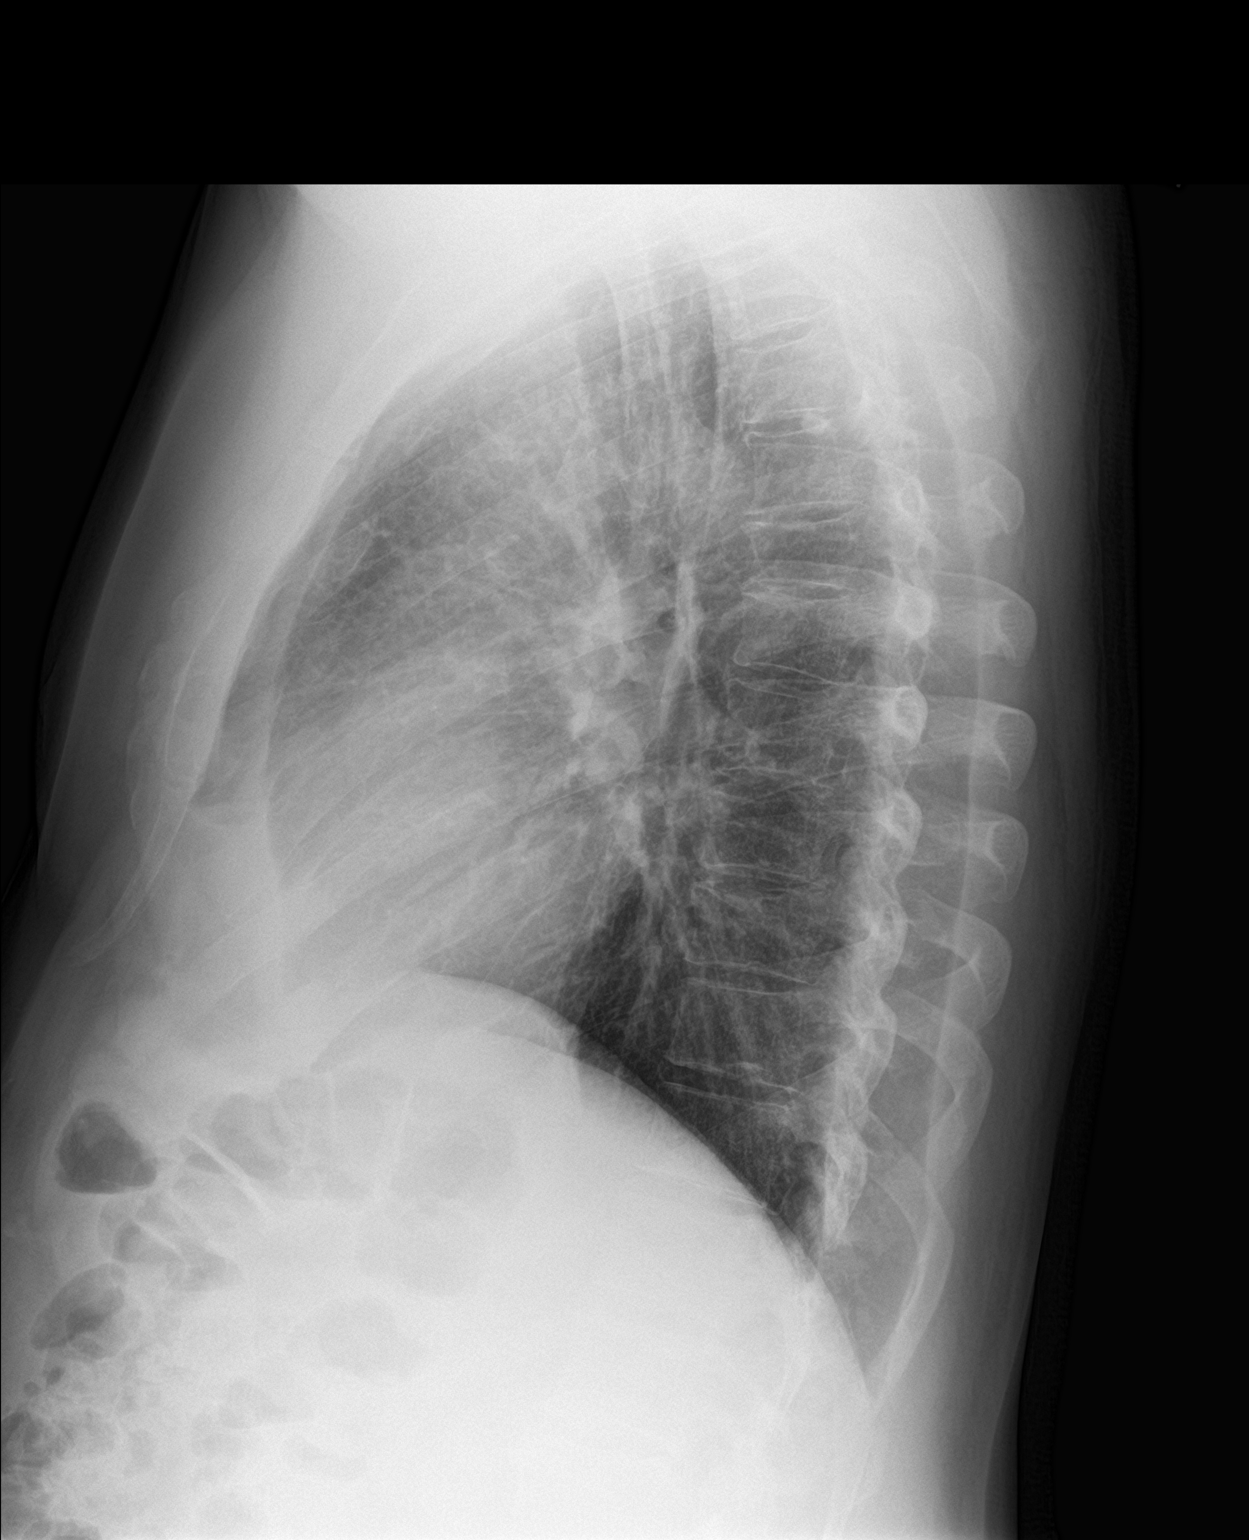

[2 of 2 positions shown; findings below may reference images not displayed]

FINDINGS: The heart size and mediastinal contours are within normal limits.
Both lungs are clear. The visualized skeletal structures are
unremarkable.
IMPRESSION: No active cardiopulmonary disease.

## 2017-08-27 ENCOUNTER — Ambulatory Visit: Payer: 59 | Admitting: Pulmonary Disease

## 2017-08-27 ENCOUNTER — Encounter: Payer: Self-pay | Admitting: Pulmonary Disease

## 2017-08-27 ENCOUNTER — Ambulatory Visit (INDEPENDENT_AMBULATORY_CARE_PROVIDER_SITE_OTHER)
Admission: RE | Admit: 2017-08-27 | Discharge: 2017-08-27 | Disposition: A | Discharge status: Home / Self Care | Payer: 59 | Source: Ambulatory Visit | Attending: Pulmonary Disease | Admitting: Pulmonary Disease

## 2017-08-27 VITALS — BP 142/90 | HR 62 | Ht 66.0 in | Wt 213.0 lb

## 2017-08-27 DIAGNOSIS — R0609 Other forms of dyspnea: Secondary | ICD-10-CM | POA: Diagnosis not present

## 2017-08-27 DIAGNOSIS — Z720 Tobacco use: Secondary | ICD-10-CM

## 2017-08-27 DIAGNOSIS — G4733 Obstructive sleep apnea (adult) (pediatric): Secondary | ICD-10-CM | POA: Diagnosis not present

## 2017-08-27 DIAGNOSIS — Z9989 Dependence on other enabling machines and devices: Secondary | ICD-10-CM

## 2017-08-27 NOTE — Progress Notes (Signed)
   Subjective:    Patient ID: Stephen Sims, male    DOB: [birth year], 52 y.o.   MRN: [id number]  HPI    Review of Systems  Constitutional: Negative for fever and unexpected weight change.  HENT: Negative for congestion, dental problem, ear pain, nosebleeds, postnasal drip, rhinorrhea, sinus pressure, sneezing, sore throat and trouble swallowing.   Eyes: Negative for redness and itching.  Respiratory: Positive for cough and shortness of breath. Negative for chest tightness and wheezing.   Cardiovascular: Negative for palpitations and leg swelling.  Gastrointestinal: Negative for nausea and vomiting.  Genitourinary: Negative for dysuria.  Musculoskeletal: Negative for joint swelling.  Skin: Negative for rash.  Allergic/Immunologic: Negative.  Negative for environmental allergies, food allergies and immunocompromised state.  Neurological: Negative for headaches.  Hematological: Does not bruise/bleed easily.  Psychiatric/Behavioral: Negative for dysphoric mood. The patient is not nervous/anxious.        Objective:   Physical Exam        Assessment & Plan:

## 2017-08-27 NOTE — Progress Notes (Signed)
Pulmonary, Critical Care, and Sleep Medicine  Chief Complaint  Patient presents with  . Sleep Apnea    former [facility] patient, CPAP- [facility]     Vital signs: BP (!) 142/90 (BP Location: Left Arm, Cuff Size: Normal)   Pulse 62   Ht  (1.676 m)   Wt 213 lb (96.6 kg)   SpO2 90%   BMI 34.38 kg/m   History of Present Illness: Stephen Sims is a 52 y.o. male smoker with dyspnea, and sleep apnea.  He was previously followed by Dr. Christene Slates, and Dr. Shelle Iron prior to this.  He uses CPAP nightly.  He has nasal mask.  Concern that he is still snoring.  Tried full face mask before, but didn't fit because of his beard.  Has noticed feeling more tired during the day.  His family also has noticed he gets jerking movements in his arms and legs while asleep.  He denies symptoms of restless legs.  He still smokes.  Tried chantix, but made him feel weird.  Tried wellbutrin, but no difference.  Also no difference from nicotine patch and gum.  Hasn't tried electronic cigarettes.  Has dry cough.  Gets winded with exertion.  No having sputum, hemoptysis, fever, swelling, chest pain.  His parents had heart disease and COPD.   Physical Exam:  General - pleasant Eyes - pupils reactive ENT - no sinus tenderness, no oral exudate, no LAN, MP 3 Cardiac - regular, no murmur Chest - no wheeze, rales Abd - soft, non tender Ext - no edema Skin - no rashes Neuro - normal strength Psych - normal mood   Assessment/Plan:  Dyspnea on exertion. - chest xray today - will schedule PFT  Tobacco abuse. - tried chantix and wellbutrin before w/o success - discussed electronic cigarettes  Obstructive sleep apnea. - he is compliant with CPAP and reports benefit - still has daytime sleepiness, and family concerned he is snoring while wearing CPAP - will arrange for chin strap to use with nasal mask - will arrange for ONO on CPAP - might need to change to different mask and might need in lab  titration  Periodic limb movements of sleep. - might need in lab titration to further assess   Patient Instructions  Chest xray today Will schedule overnight oxygen test and pulmonary function test Will have [facility] arrange for a chin strap for your CPAP mask  Follow up in 3 months    Coralyn Helling, MD [facility] Pulmonary/Critical Care 08/27/2017, 12:04 PM  Flow Sheet  Pulmonary tests:  Sleep tests: Auto CPAP 07/28/17 to 08/26/17 >> used on 30 of 30 nights with average 8 hrs 3 min.  Average AHI 1.4 with median CPAP 13 and 95 th percentile CPAP 18 cm H2O  Review of Systems: Constitutional: Negative for fever and unexpected weight change.  HENT: Negative for congestion, dental problem, ear pain, nosebleeds, postnasal drip, rhinorrhea, sinus pressure, sneezing, sore throat and trouble swallowing.   Eyes: Negative for redness and itching.  Respiratory: Positive for cough and shortness of breath. Negative for chest tightness and wheezing.   Cardiovascular: Negative for palpitations and leg swelling.  Gastrointestinal: Negative for nausea and vomiting.  Genitourinary: Negative for dysuria.  Musculoskeletal: Negative for joint swelling.  Skin: Negative for rash.  Allergic/Immunologic: Negative.  Negative for environmental allergies, food allergies and immunocompromised state.  Neurological: Negative for headaches.  Hematological: Does not bruise/bleed easily.  Psychiatric/Behavioral: Negative for dysphoric mood. The patient is not nervous/anxious.  Past Medical History: He  has a past medical history of Depression and Sleep apnea.  Past Surgical History: He  has a past surgical history that includes Tonsillectomy and Wisdom tooth extraction.  Family History: His family history includes Atrial fibrillation in his father and mother; COPD in his father; Emphysema in his father; Heart disease in his father.  Social History: He  reports that he has been smoking cigarettes.  He has a  52.50 pack-year smoking history. He has never used smokeless tobacco. He reports that he drinks alcohol. He reports that he does not use drugs.  Medications: Allergies as of 08/27/2017      Reactions   Codeine    Erythromycin    Penicillins       Medication List        Accurate as of 08/27/17 12:04 PM. Always use your most recent med list.          venlafaxine 100 MG tablet Commonly known as:  EFFEXOR Take 1 tablet by mouth daily.

## 2017-08-27 NOTE — Patient Instructions (Signed)
Chest xray today Will schedule overnight oxygen test and pulmonary function test Will have [facility] arrange for a chin strap for your CPAP mask  Follow up in 3 months

## 2017-08-28 ENCOUNTER — Telehealth: Payer: Self-pay | Admitting: Pulmonary Disease

## 2017-08-28 NOTE — Telephone Encounter (Signed)
Dg Chest 2 View  Result Date: 08/27/2017 CLINICAL DATA:  52 year old male with nonproductive cough and shortness of breath on exertion for 2 months. Smoker. EXAM: CHEST - 2 VIEW COMPARISON:  08/18/2015 chest radiographs. FINDINGS: Stable cardiac size at the upper limits of normal. Other mediastinal contours are within normal limits. Visualized tracheal air column is within normal limits. Lung volumes are stable and within normal limits. No pneumothorax, pulmonary edema, pleural effusion or confluent pulmonary opacity. No acute osseous abnormality identified. Negative visible bowel gas pattern. IMPRESSION: No acute cardiopulmonary abnormality. Electronically Signed   By: Odessa Fleming M.D.   On: 08/27/2017 17:08     Please leg him know his chest was normal.

## 2017-08-28 NOTE — Telephone Encounter (Signed)
Spoke with patient. He is aware of his results. Verbalized understanding. Nothing else needed at time of call.

## 2017-09-17 ENCOUNTER — Telehealth: Payer: Self-pay | Admitting: Pulmonary Disease

## 2017-09-17 NOTE — Telephone Encounter (Signed)
Called and spoke with patient regarding results.  Informed the patient of results and recommendations today. Pt verbalized understanding and denied any questions or concerns at this time.  Nothing further needed.  

## 2017-09-17 NOTE — Telephone Encounter (Signed)
ONO with CPAP 09/10/17 >> test time 9 hrs 1 min.  Basal SpO2 93.4%, low SpO2 88%.     Please let him know his overnight oxygen test looked good.  He doesn't need supplemental oxygen at night.

## 2020-08-04 ENCOUNTER — Other Ambulatory Visit: Payer: Self-pay

## 2020-08-04 ENCOUNTER — Ambulatory Visit: Payer: 59 | Admitting: Pulmonary Disease

## 2020-08-04 ENCOUNTER — Encounter: Payer: Self-pay | Admitting: Pulmonary Disease

## 2020-08-04 VITALS — BP 150/96 | HR 89 | Temp 97.9°F | Ht 66.0 in | Wt 219.0 lb

## 2020-08-04 DIAGNOSIS — Z720 Tobacco use: Secondary | ICD-10-CM

## 2020-08-04 DIAGNOSIS — G4733 Obstructive sleep apnea (adult) (pediatric): Secondary | ICD-10-CM | POA: Diagnosis not present

## 2020-08-04 DIAGNOSIS — R0602 Shortness of breath: Secondary | ICD-10-CM | POA: Diagnosis not present

## 2020-08-04 NOTE — Patient Instructions (Signed)
Will arrange for pulmonary function test  Will arrange for referral to [facility] to discuss lung cancer screening program  Will have [facility] change your CPAP setting to 5 - 16 cm water pressure  Follow up in 6 weeks

## 2020-08-04 NOTE — Progress Notes (Signed)
[facility], [facility]  Chief Complaint  Patient presents with  . Follow-up    Has some issues with cpap, shortness of breath with activity    Constitutional:  BP (!) 150/96 (BP Location: Left Arm, Cuff Size: Normal)   Pulse 89   Temp 97.9 F (36.6 C) (Temporal)   Ht 5\' 6"  (1.676 m)   Wt 219 lb (99.3 kg)   SpO2 97% Comment: Room air  BMI 35.35 kg/m   Past Medical History:  Depression  Past Surgical History:  He  has a past surgical history that includes Tonsillectomy and Wisdom tooth extraction.  Brief Summary:  Stephen Sims is a 55 y.o. male smoker with dyspnea and obstructive sleep apnea.      Subjective:   I last saw him in 2019.  He wasn't able to schedule PFT due to the pandemic.  He still smokes cigarettes, and down to 1 ppd now.  He gets winded with activities.  Has intermittent cough with clear sputum.  He uses CPAP nightly.  He has to clamp his mask down, and gets marks on his face in the morning.  His wife says he intermittently snores and kicks his legs.  He feels like his sleep is good.  Physical Exam:   Appearance - well kempt   ENMT - no sinus tenderness, no oral exudate, no LAN, Mallampati 4 airway, no stridor  Respiratory - equal breath sounds bilaterally, no wheezing or rales  CV - s1s2 regular rate and rhythm, no murmurs  Ext - no clubbing, no edema  Skin - no rashes  Psych - normal mood and affect   Pulmonary testing:    Chest Imaging:    Sleep Tests:   Auto CPAP 07/03/20 to 08/01/20 >> used on 30 of 30 nights with average 8 hrs 29 min.  Average AHI 1.3 with median CPAP 14 and 95 th percentile CPAP 19 cm H2O.  Air leak.  Cardiac Tests:    Social History:  He  reports that he has been smoking cigarettes. He has a 52.50 pack-year smoking history. He has never used smokeless tobacco. He reports current alcohol use. He reports that he does not use drugs.  Family History:  His family history  includes Atrial fibrillation in his father and mother; COPD in his father; Emphysema in his father; Heart disease in his father.     Assessment/Plan:   Dyspnea on exertion. - will arrange for pulmonary function test to assess for obstructive lung disease - if pulmonary assessment unrevealing, then he might need further cardiac assessment  Tobacco abuse. - tried chantix and wellbutrin before w/o success - will arrange for referral to 10/01/20 to discuss lung cancer screening program and options to help with smoking cessation  Obstructive sleep apnea. - he is compliant with CPAP and reports benefit - he uses [facility] for his DME - will change his auto CPAP to 5 - 16 cm H2O - if he is still having mask leak, then he would need mask refitting - if he still has leg kicking that starts to cause him trouble with sleep, then he would need a CPAP titration study; explained how periodic limb movements can be a benign phenomenon with clinical significance  Time Spent Involved in Patient Care on Day of Examination:  31 minutes  Follow up:  Patient Instructions  Will arrange for pulmonary function test  Will arrange for referral to [facility] to discuss lung cancer screening program  Will have [facility] change your CPAP setting to 5 - 16 cm water pressure  Follow up in 6 weeks   Medication List:   Allergies as of 08/04/2020      Reactions   Codeine    Erythromycin    Penicillins       Medication List       Accurate as of August 04, 2020 10:44 AM. If you have any questions, ask your nurse or doctor.        venlafaxine 100 MG tablet Commonly known as: EFFEXOR Take 1 tablet by mouth daily.       Signature:  Coralyn Helling, MD [facility] Pulmonary/Critical Care Pager - [phone] 08/04/2020, 10:44 AM

## 2020-08-09 ENCOUNTER — Telehealth: Payer: Self-pay | Admitting: Pulmonary Disease

## 2020-08-09 DIAGNOSIS — G4733 Obstructive sleep apnea (adult) (pediatric): Secondary | ICD-10-CM

## 2020-08-09 NOTE — Telephone Encounter (Signed)
Called and spoke with patient's wife. She stated that the patient's cpap machine displayed a message of "motor life has been exceeded". She is requesting to have an order sent to [facility] for a replacement cpap machine. Patient was last seen on 08/04/20 by VS. At that office, VS requested to have the pressure changed to auto cpap 5-16cm.   VS, please advise if you are ok with sending an order for a replacement cpap machine. Thanks!

## 2020-08-11 NOTE — Telephone Encounter (Signed)
VS please advise. Thanks! 

## 2020-08-16 NOTE — Telephone Encounter (Signed)
Okay to send order to arrange for new auto CPAP machine with pressure range of 5 to 16 cm H2O with heated humidity.

## 2020-08-16 NOTE — Telephone Encounter (Signed)
Called patient's wife but she did not answer. Left message for patient or patient's wife to call back.

## 2020-08-17 NOTE — Telephone Encounter (Signed)
Lmtcb for pt's wife, [name].

## 2020-08-17 NOTE — Telephone Encounter (Signed)
Pt's wife called back, advised that we would be ordering a new cpap for pt. Pt expressed understanding. cpap ordered. Nothing further needed at this time- will close encounter.

## 2020-09-17 ENCOUNTER — Other Ambulatory Visit (HOSPITAL_COMMUNITY): Payer: Managed Care, Other (non HMO)

## 2020-09-21 ENCOUNTER — Ambulatory Visit: Payer: Managed Care, Other (non HMO) | Admitting: Pulmonary Disease

## 2020-09-28 ENCOUNTER — Other Ambulatory Visit (HOSPITAL_COMMUNITY)
Admission: RE | Admit: 2020-09-28 | Discharge: 2020-09-28 | Disposition: A | Discharge status: Home / Self Care | Payer: Managed Care, Other (non HMO) | Source: Ambulatory Visit | Attending: Pulmonary Disease | Admitting: Pulmonary Disease

## 2020-09-28 DIAGNOSIS — Z20822 Contact with and (suspected) exposure to covid-19: Secondary | ICD-10-CM | POA: Insufficient documentation

## 2020-09-28 DIAGNOSIS — Z01812 Encounter for preprocedural laboratory examination: Secondary | ICD-10-CM | POA: Diagnosis present

## 2020-09-28 LAB — SARS CORONAVIRUS 2 (TAT 6-24 HRS): SARS Coronavirus 2: NEGATIVE

## 2020-09-29 ENCOUNTER — Encounter: Payer: Self-pay | Admitting: Adult Health

## 2020-09-29 ENCOUNTER — Other Ambulatory Visit: Payer: Self-pay

## 2020-09-29 ENCOUNTER — Ambulatory Visit (INDEPENDENT_AMBULATORY_CARE_PROVIDER_SITE_OTHER): Payer: Managed Care, Other (non HMO) | Admitting: Adult Health

## 2020-09-29 ENCOUNTER — Ambulatory Visit (INDEPENDENT_AMBULATORY_CARE_PROVIDER_SITE_OTHER): Payer: Managed Care, Other (non HMO) | Admitting: Pulmonary Disease

## 2020-09-29 DIAGNOSIS — G4733 Obstructive sleep apnea (adult) (pediatric): Secondary | ICD-10-CM | POA: Diagnosis not present

## 2020-09-29 DIAGNOSIS — Z720 Tobacco use: Secondary | ICD-10-CM

## 2020-09-29 DIAGNOSIS — R0600 Dyspnea, unspecified: Secondary | ICD-10-CM

## 2020-09-29 DIAGNOSIS — R0609 Other forms of dyspnea: Secondary | ICD-10-CM

## 2020-09-29 DIAGNOSIS — R0602 Shortness of breath: Secondary | ICD-10-CM

## 2020-09-29 LAB — PULMONARY FUNCTION TEST
DL/VA % pred: 122 %
DL/VA: 5.39 ml/min/mmHg/L
DLCO cor % pred: 103 %
DLCO cor: 25.35 ml/min/mmHg
DLCO unc % pred: 103 %
DLCO unc: 25.35 ml/min/mmHg
FEF 25-75 Post: 1.79 L/sec
FEF 25-75 Pre: 1.55 L/sec
FEF2575-%Change-Post: 15 %
FEF2575-%Pred-Post: 64 %
FEF2575-%Pred-Pre: 56 %
FEV1-%Change-Post: 1 %
FEV1-%Pred-Post: 72 %
FEV1-%Pred-Pre: 71 %
FEV1-Post: 2.3 L
FEV1-Pre: 2.27 L
FEV1FVC-%Change-Post: -1 %
FEV1FVC-%Pred-Pre: 98 %
FEV6-%Change-Post: 2 %
FEV6-%Pred-Post: 77 %
FEV6-%Pred-Pre: 75 %
FEV6-Post: 3.07 L
FEV6-Pre: 3 L
FEV6FVC-%Change-Post: 0 %
FEV6FVC-%Pred-Post: 103 %
FEV6FVC-%Pred-Pre: 103 %
FVC-%Change-Post: 2 %
FVC-%Pred-Post: 74 %
FVC-%Pred-Pre: 72 %
FVC-Post: 3.1 L
FVC-Pre: 3.03 L
Post FEV1/FVC ratio: 74 %
Post FEV6/FVC ratio: 99 %
Pre FEV1/FVC ratio: 75 %
Pre FEV6/FVC Ratio: 99 %
RV % pred: 59 %
RV: 1.13 L
TLC % pred: 70 %
TLC: 4.28 L

## 2020-09-29 MED ORDER — ALBUTEROL SULFATE HFA 108 (90 BASE) MCG/ACT IN AERS: 1.0000 | INHALATION_SPRAY | Freq: Four times a day (QID) | RESPIRATORY_TRACT | 2 refills | Status: DC | PRN

## 2020-09-29 NOTE — Progress Notes (Signed)
@Patient  ID: Stephen Sims, male    DOB: 1965/06/07, 55 y.o.   MRN: [id number]  Chief Complaint  Patient presents with  . Follow-up    Referring provider: [id number], MD  HPI: 55 year old male active smoker followed for obstructive sleep apnea and dyspnea  TEST/EVENTS :   09/29/2020 Follow up : OSA and Dyspnea  Patient returns for a 26-month follow-up.  Patient has underlying obstructive sleep apnea.  Patient says he wears his CPAP every night.  He says he cannot sleep without his CPAP machine.  Typically gets in about 6 to 7 hours each night.  Patient denies any significant daytime sleepiness.  Feels rested and feels that he benefits from CPAP.  Patient is an active smoker smokes about 1 pack a day.  Has a 52 pack year history.  Patient has tried Chantix and Wellbutrin in the past without significant benefit.  Patient has significant side effects with Chantix.  We discussed in detail helpful cessation tips. Patient has had some ongoing shortness of breath over the last few years.  He says he gets winded if he tries to go upstairs.  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  Says he has to go up steps quite frequently.  Does feel that he gets a little short of breath when he goes up.  Has minimum cough or wheezing.  Last chest x-ray was 2019 that showed no acute process.  Patient was set up for pulmonary function testing that showed mild restriction with an FEV1 at 72%, ratio 74, FVC 74%, no significant bronchodilator response.  DLCO was 103%. Patient denies any chest pain, palpitations, radiating pains, syncope.  He has no history of high blood pressure or hyperlipidemia.  Has no family history of coronary artery disease.  Patient says his father did have congestive heart failure later in life.     Allergies  Allergen Reactions  . Codeine   . Erythromycin   . Penicillins      There is no immunization history on file for this patient.  Past  Medical History:  Diagnosis Date  . Depression   . Sleep apnea     Tobacco History: Social History   Tobacco Use  Smoking Status Current Every Day Smoker  . Packs/day: 1.50  . Years: 35.00  . Pack years: 52.50  . Types: Cigarettes  Smokeless Tobacco Never Used  Tobacco Comment   smokes a little over a pack per day 09/29/20   Ready to quit: No Counseling given: Yes Comment: smokes a little over a pack per day 09/29/20   Outpatient Medications Prior to Visit  Medication Sig Dispense Refill  . venlafaxine (EFFEXOR) 100 MG tablet Take 1 tablet by mouth daily.     No facility-administered medications prior to visit.     Review of Systems:   Constitutional:   No  weight loss, night sweats,  Fevers, chills,  +fatigue, or  lassitude.  HEENT:   No headaches,  Difficulty swallowing,  Tooth/dental problems, or  Sore throat,                No sneezing, itching, ear ache, nasal congestion, post nasal drip,   CV:  No chest pain,  Orthopnea, PND, swelling in lower extremities, anasarca, dizziness, palpitations, syncope.   GI  No heartburn, indigestion, abdominal pain, nausea, vomiting, diarrhea, change in bowel habits, loss of appetite, bloody stools.   Resp:    No chest wall deformity  Skin:  no rash or lesions.  GU: no dysuria, change in color of urine, no urgency or frequency.  No flank pain, no hematuria   MS:  No joint pain or swelling.  No decreased range of motion.  No back pain.    Physical Exam  BP 130/84 (BP Location: Left Arm, Patient Position: Sitting, Cuff Size: Large)   Pulse 81   Temp 97.6 F (36.4 C) (Temporal)   Ht 5' 5.5" (1.664 m)   Wt 218 lb (98.9 kg)   SpO2 98%   BMI 35.73 kg/m   GEN: A/Ox3; pleasant , NAD, well nourished    HEENT:  [place]/AT,   NOSE-clear, THROAT-clear, no lesions, no postnasal drip or exudate noted. Class 2-3 MP airway   NECK:  Supple w/ fair ROM; no JVD; normal carotid impulses w/o bruits; no thyromegaly or nodules palpated; no  lymphadenopathy.    RESP  Clear  P & A; w/o, wheezes/ rales/ or rhonchi. no accessory muscle use, no dullness to percussion  CARD:  RRR, no m/r/g, no peripheral edema, pulses intact, no cyanosis or clubbing.  GI:   Soft & nt; nml bowel sounds; no organomegaly or masses detected.   Musco: Warm bil, no deformities or joint swelling noted.   Neuro: alert, no focal deficits noted.    Skin: Warm, no lesions or rashes    Lab Results:  CBC No results found for: WBC, RBC, HGB, HCT, PLT, MCV, MCH, MCHC, RDW, LYMPHSABS, MONOABS, EOSABS, BASOSABS  BMET No results found for: NA, K, CL, CO2, GLUCOSE, BUN, CREATININE, CALCIUM, GFRNONAA, GFRAA  BNP No results found for: BNP  ProBNP No results found for: PROBNP  Imaging: No results found.    PFT Results Latest Ref Rng & Units 09/29/2020  FVC-Pre L 3.03  FVC-Predicted Pre % 72  FVC-Post L 3.10  FVC-Predicted Post % 74  Pre FEV1/FVC % % 75  Post FEV1/FCV % % 74  FEV1-Pre L 2.27  FEV1-Predicted Pre % 71  FEV1-Post L 2.30  DLCO uncorrected ml/min/mmHg 25.35  DLCO UNC% % 103  DLCO corrected ml/min/mmHg 25.35  DLCO COR %Predicted % 103  DLVA Predicted % 122  TLC L 4.28  TLC % Predicted % 70  RV % Predicted % 59    No results found for: NITRICOXIDE      Assessment & Plan:   OSA (obstructive sleep apnea) Great compliance and perceived benefit   Plan  Patient Instructions  Albuterol Inhaler 1-2 puffs every 6 hr as needed.  Work on [employer] loss  Refer to Lung cancer screening program with Jefm Bryant NP  Work on not smoking .   Continue on CPAP At bedtime   Work on healthy weight .  Do not drive if sleepy  Follow up with Dr. Craige Cotta  In 4-6 months and As needed   Please contact office for sooner follow up if symptoms do not improve or worsen or seek emergency care          Exertional dyspnea Mild restriction on PFT ? RAD /Asthma . No significant airflow obstruction  Refer to LDCT program . Very heavy smoking hx  -cessation discussed  Trial of Albuterol  No chest pain , FH , risk factor other than smoking /obesity for CAD - hold on cards referral for now  Plan  Patient Instructions  Albuterol Inhaler 1-2 puffs every 6 hr as needed.  Work on [employer] loss  Refer to Lung cancer screening program with Jefm Bryant NP  Work on not  smoking .   Continue on CPAP At bedtime   Work on healthy weight .  Do not drive if sleepy  Follow up with Dr. Craige Cotta  In 4-6 months and As needed   Please contact office for sooner follow up if symptoms do not improve or worsen or seek emergency care        '  Tobacco user Smoking cessation  Refer to LDCT screening program      Rubye Oaks, NP 09/29/2020

## 2020-09-29 NOTE — Assessment & Plan Note (Signed)
Smoking cessation  Refer to LDCT screening program

## 2020-09-29 NOTE — Assessment & Plan Note (Signed)
Great compliance and perceived benefit   Plan  Patient Instructions  Albuterol Inhaler 1-2 puffs every 6 hr as needed.  Work on [employer] loss  Refer to Lung cancer screening program with Jefm Bryant NP  Work on not smoking .   Continue on CPAP At bedtime   Work on healthy weight .  Do not drive if sleepy  Follow up with Dr. Craige Cotta  In 4-6 months and As needed   Please contact office for sooner follow up if symptoms do not improve or worsen or seek emergency care

## 2020-09-29 NOTE — Assessment & Plan Note (Signed)
Mild restriction on PFT ? RAD /Asthma . No significant airflow obstruction  Refer to LDCT program . Very heavy smoking hx -cessation discussed  Trial of Albuterol  No chest pain , FH , risk factor other than smoking /obesity for CAD - hold on cards referral for now  Plan  Patient Instructions  Albuterol Inhaler 1-2 puffs every 6 hr as needed.  Work on [employer] loss  Refer to Lung cancer screening program with Jefm Bryant NP  Work on not smoking .   Continue on CPAP At bedtime   Work on healthy weight .  Do not drive if sleepy  Follow up with Dr. Craige Cotta  In 4-6 months and As needed   Please contact office for sooner follow up if symptoms do not improve or worsen or seek emergency care        '

## 2020-09-29 NOTE — Patient Instructions (Addendum)
Albuterol Inhaler 1-2 puffs every 6 hr as needed.  Work on [employer] loss  Refer to Lung cancer screening program with Jefm Bryant NP  Work on not smoking .   Continue on CPAP At bedtime   Work on healthy weight .  Do not drive if sleepy  Follow up with Dr. Craige Cotta  In 4-6 months and As needed   Please contact office for sooner follow up if symptoms do not improve or worsen or seek emergency care

## 2020-09-29 NOTE — Progress Notes (Signed)
PFT done today. 

## 2020-09-30 NOTE — Progress Notes (Signed)
Reviewed and agree with assessment/plan.   Aldair Rickel, MD [facility] 09/30/2020, 8:58 AM Pager:  [phone]  

## 2020-10-06 ENCOUNTER — Telehealth: Payer: Self-pay | Admitting: Pulmonary Disease

## 2020-10-06 NOTE — Telephone Encounter (Signed)
FYI: Due to multiple unsuccessful attempts to contact pt to schedule for lung cancer screening , this referral has been cancelled. Letter sent to pt to make him aware.

## 2021-09-07 ENCOUNTER — Encounter: Payer: Self-pay | Admitting: Nurse Practitioner

## 2021-09-07 ENCOUNTER — Ambulatory Visit: Payer: Managed Care, Other (non HMO) | Admitting: Nurse Practitioner

## 2021-09-07 VITALS — BP 120/80 | HR 92 | Temp 98.2°F | Ht 66.0 in | Wt 214.8 lb

## 2021-09-07 DIAGNOSIS — J189 Pneumonia, unspecified organism: Secondary | ICD-10-CM | POA: Diagnosis not present

## 2021-09-07 DIAGNOSIS — J432 Centrilobular emphysema: Secondary | ICD-10-CM

## 2021-09-07 DIAGNOSIS — R0609 Other forms of dyspnea: Secondary | ICD-10-CM

## 2021-09-07 DIAGNOSIS — Z720 Tobacco use: Secondary | ICD-10-CM | POA: Diagnosis not present

## 2021-09-07 DIAGNOSIS — G4733 Obstructive sleep apnea (adult) (pediatric): Secondary | ICD-10-CM

## 2021-09-07 MED ORDER — STIOLTO RESPIMAT 2.5-2.5 MCG/ACT IN AERS: 2.0000 | INHALATION_SPRAY | Freq: Every day | RESPIRATORY_TRACT | 0 refills | Status: DC

## 2021-09-07 NOTE — Assessment & Plan Note (Addendum)
Former heavy smoker. Has cut back recently but now vaping throughout the day. Mild centrilobular emphysema and mild BTX on LDCT chest from October. Did have a mild restriction on previous PFTs, which could be related to his weight or possible underlying ILD? If no improvement with Stiolto or if symptoms progress, we will do formal ILD workup.  ?

## 2021-09-07 NOTE — Patient Instructions (Addendum)
Trial Stiolto 2 puffs daily for 1 month ?Continue Albuterol inhaler 2 puffs every 6 hours as needed for shortness of breath or wheezing. Notify if symptoms persist despite rescue inhaler/neb use. ? ?Continue CPAP nightly minimum of 4-6 hours. Change pressure to 10-18 cmH2O. Contact DME about your humidification/condensation if this persists despite pressure change  ? ?Lung cancer screening program referral ? ?Smoking cessation referral ? ?Follow up in 4 weeks with CXR before with Dr. Craige Cotta or Philis Nettle. If symptoms do not improve or worsen, please contact office for sooner follow up or seek emergency care. ?

## 2021-09-07 NOTE — Assessment & Plan Note (Signed)
Excellent compliance. Breakthrough snoring. Residual AHI shows OSA well-controlled and without significant leaks. Based on his pressure use, we will adjust to 10-18 cmH2O and see if this helps. Advised he follow up with [employer] if he continues to experience issues with condensation for trouble shooting of his machine.  ?

## 2021-09-07 NOTE — Progress Notes (Signed)
Patient seen in the office today and instructed on use of stiolto.  Patient expressed understanding and demonstrated technique. 

## 2021-09-07 NOTE — Progress Notes (Signed)
? ?@Patient  ID: Stephen Sims, male    DOB: [birth year], 56 y.o.   MRN: [id number] ? ?Chief Complaint  ?Patient presents with  ? Follow-up  ?  Snoring with CPAP since had PNA (2 weeks)  getting worse.  Patient reports moisture in nasal mask.  Dry cough mostly.  Sometimes gets mucous up, unsure of color.  Using albuterol inhaler 3/day.    ? ? ?Referring provider: ?Christain Sacramento, MD ? ?HPI: ?56 year old male, active smoker followed for OSA on CPAP and DOE. He is a patient of Dr. Juanetta Gosling and last seen in office on 09/29/2020. Past medical history significant for depression. ? ?TEST/EVENTS:  ?09/29/2020 PFTs: FVC 74, FEV1 72, ratio 74, TLC 70, DLCOcor 103. Mild restrictive airway disease with normal diffusion capacity. No significant BD.  ?02/24/2021 LDCT chest (only read available and reviewed): benign calcified nodule in RUL; multiple scattered micronodules.There is mild centrilobular emphysema. Mild lower lobe BTX. No LAD. There is mild CAD.  ? ?09/29/2020: OV with Parrett,NP for follow up. Wearing CPAP nightly with good benefit. Active smoker - 1 ppd. Has tried chantix and wellbutrin in past without success and had significant side effects from Chantix. Reports DOE over the last several years. Completed PFTs which showed a mild restriction and normal DLCO. Recommended trial of albuterol. Referred to LDCT program.  ? ?09/07/2021: Today - follow up ?Patient presents today for follow up with his wife. He was treated around 3 weeks ago for pneumonia and then suspected bronchitis by his PCP. He completed abx around a week and a half ago. Reports that he has been feeling better since. Cough has returned to his baseline, which is productive, primarily in the mornings, and his DOE is about how it normally is. Gets winded with incline walking, especially stairs. He denies fevers, fatigue, weight loss, anorexia, or hemoptysis. No lower extremity swelling. Uses albuterol inhaler 3 times a day. He did undergo a LDCT through his PCP in  October. Results were reviewed today which showed mild centrilobular emphysema and some mild bronchiectasis. He did have a mild restriction on his previous PFTs; no formal obstruction. 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 He also does carpentry work as a hobby and has cats at home. He continues to smoke 1/4 pack a day but has now taken up vaping as he thought this was better than smoking. He has tried Chantix in the past with negative side effects, nicotene patches without much success and Wellbutrin without much success. ? ?Regarding his CPAP, he continues to wear it every night without fail and receive good benefit from it. His wife has noticed him snoring some around it recently. He has also noticed that in the morning, he wakes up with his face wet and condensation in his mask. Denies excessive daytime fatigue, morning headaches, drowsy driving or narcolepsy.  ? ?08/07/2021-09/05/2021 ?AirView Download CPAP 5-16 cmH2O ?30/30 days used; 100% >4 hours; av usage 9 hours 11 min ?Pressure median 12.4, 95th 15.7 ?Leaks median 0.4, 95th 14.6  ?AHI 1.9 ? ?Allergies  ?Allergen Reactions  ? Codeine   ? Erythromycin   ? Penicillins   ? ? ? ?There is no immunization history on file for this patient. ? ?Past Medical History:  ?Diagnosis Date  ? Depression   ? Sleep apnea   ? ? ?Tobacco History: ?Social History  ? ?Tobacco Use  ?Smoking Status Every Day  ? Packs/day: 1.50  ?  Years: 35.00  ? Pack years: 52.50  ? Types: Cigarettes  ?Smokeless Tobacco Never  ?Tobacco Comments  ? Smoking 5-6 cigarettes/day.  Vaping throughout the day.  09/07/2021 hfb  ? ?Ready to quit: Not Answered ?Counseling given: Not Answered ?Tobacco comments: Smoking 5-6 cigarettes/day.  Vaping throughout the day.  09/07/2021 hfb ? ? ?Outpatient Medications Prior to Visit  ?Medication Sig Dispense Refill  ? albuterol (VENTOLIN HFA) 108 (90 Base) MCG/ACT inhaler Inhale 1-2 puffs  into the lungs every 6 (six) hours as needed for wheezing or shortness of breath. 8 g 2  ? amLODipine (NORVASC) 5 MG tablet TAKE 1 TABLET BY MOUTH EVERYDAY AT BEDTIME    ? venlafaxine (EFFEXOR) 100 MG tablet Take 1 tablet by mouth daily.    ? ?No facility-administered medications prior to visit.  ? ? ? ?Review of Systems:  ? ?Constitutional: No weight loss or gain, night sweats, fevers, chills, fatigue, or lassitude. ?HEENT: No headaches, difficulty swallowing, tooth/dental problems, or sore throat. No sneezing, itching, ear ache, nasal congestion, or post nasal drip ?CV:  No chest pain, orthopnea, PND, swelling in lower extremities, anasarca, dizziness, palpitations, syncope ?Resp: +shortness of breath with exertion (mainly incline walking/climbing); chronic productive cough (baseline). No excess mucus or change in color of mucus. No hemoptysis. No wheezing.  No chest wall deformity ?GI:  No heartburn, indigestion, abdominal pain, nausea, vomiting, diarrhea, change in bowel habits, loss of appetite, bloody stools.  ?GU: No dysuria, change in color of urine, urgency or frequency.  No flank pain, no hematuria  ?Skin: No rash, lesions, ulcerations ?MSK:  No joint pain or swelling.  No decreased range of motion.  No back pain. ?Neuro: No dizziness or lightheadedness.  ?Psych: No depression or anxiety. Mood stable.  ? ? ? ?Physical Exam: ? ?BP 120/80 (BP Location: Right Arm, Patient Position: Sitting, Cuff Size: Large)   Pulse 92   Temp 98.2 ?F (36.8 ?C) (Oral)   Ht 5\' 6"  (1.676 m)   Wt 214 lb 12.8 oz (97.4 kg)   SpO2 97%   BMI 34.67 kg/m?  ? ?GEN: Pleasant, interactive, well-appearing; obese; in no acute distress. ?HEENT:  Normocephalic and atraumatic. PERRLA. Sclera white. Nasal turbinates pink, moist and patent bilaterally. No rhinorrhea present. Oropharynx pink and moist, without exudate or edema. No lesions, ulcerations, or postnasal drip.  ?NECK:  Supple w/ fair ROM. No JVD present. Normal carotid impulses  w/o bruits. Thyroid symmetrical with no goiter or nodules palpated. No lymphadenopathy.   ?CV: RRR, no m/r/g, no peripheral edema. Pulses intact, +2 bilaterally. No cyanosis, pallor or clubbing. ?PULMONARY:  Unlabored, regular breathing. Clear bilaterally A&P w/o wheezes/rales/rhonchi. No accessory muscle use. No dullness to percussion. ?GI: BS present and normoactive. Soft, non-tender to palpation. No organomegaly or masses detected. No CVA tenderness. ?MSK: No erythema, warmth or tenderness. Cap refil <2 sec all extrem. No deformities or joint swelling noted.  ?Neuro: A/Ox3. No focal deficits noted.   ?Skin: Warm, no lesions or rashe ?Psych: Normal affect and behavior. Judgement and thought content appropriate.  ? ? ? ?Lab Results: ? ?CBC ?No results found for: WBC, RBC, HGB, HCT, PLT, MCV, MCH, MCHC, RDW, LYMPHSABS, MONOABS, EOSABS, BASOSABS ? ?BMET ?No results found for: NA, K, CL, CO2, GLUCOSE, BUN, CREATININE, CALCIUM, GFRNONAA, GFRAA ? ?BNP ?No results found for: BNP ? ? ?Imaging: ? ?No results found. ? ? ? ? ?  Latest Ref Rng & Units 09/29/2020  ?  8:53 AM  ?PFT Results  ?FVC-Pre L  3.03    ?FVC-Predicted Pre % 72    ?FVC-Post L 3.10    ?FVC-Predicted Post % 74    ?Pre FEV1/FVC % % 75    ?Post FEV1/FCV % % 74    ?FEV1-Pre L 2.27    ?FEV1-Predicted Pre % 71    ?FEV1-Post L 2.30    ?DLCO uncorrected ml/min/mmHg 25.35    ?DLCO UNC% % 103    ?DLCO corrected ml/min/mmHg 25.35    ?DLCO COR %Predicted % 103    ?DLVA Predicted % 122    ?TLC L 4.28    ?TLC % Predicted % 70    ?RV % Predicted % 59    ? ? ?No results found for: NITRICOXIDE ? ? ? ? ? ?Assessment & Plan:  ? ?Centrilobular emphysema ([place]) ?Mild centrilobular emphysema on CT chest; does report good benefit from albuterol rescue. Advised trial of Stiolto - samples provided today.  ? ?Patient Instructions  ?Trial Stiolto 2 puffs daily for 1 month ?Continue Albuterol inhaler 2 puffs every 6 hours as needed for shortness of breath or wheezing. Notify if symptoms  persist despite rescue inhaler/neb use. ? ?Continue CPAP nightly minimum of 4-6 hours. Change pressure to 10-18 cmH2O. Contact DME about your humidification/condensation if this persists despite pressure chan

## 2021-09-07 NOTE — Assessment & Plan Note (Signed)
Mild centrilobular emphysema on CT chest; does report good benefit from albuterol rescue. Advised trial of Stiolto - samples provided today.  ? ?Patient Instructions  ?Trial Stiolto 2 puffs daily for 1 month ?Continue Albuterol inhaler 2 puffs every 6 hours as needed for shortness of breath or wheezing. Notify if symptoms persist despite rescue inhaler/neb use. ? ?Continue CPAP nightly minimum of 4-6 hours. Change pressure to 10-18 cmH2O. Contact DME about your humidification/condensation if this persists despite pressure change  ? ?Lung cancer screening program referral ? ?Smoking cessation referral ? ?Follow up in 4 weeks with CXR before with Dr. Craige Cotta or Philis Nettle. If symptoms do not improve or worsen, please contact office for sooner follow up or seek emergency care. ? ? ?

## 2021-09-07 NOTE — Assessment & Plan Note (Signed)
CAP diagnosed by PCP. Completed abx course 1.5 weeks ago. Clinically improved. Plans to repeat CXR at follow up in 4 weeks. Advised to notify if worsening productive cough, SOB, fatigue/malaise, or fevers develop.  ?

## 2021-09-07 NOTE — Progress Notes (Signed)
Reviewed and agree with assessment/plan. ? ? ?Kamauri Kathol, MD ?[facility] ?09/07/2021, 10:47 AM ?Pager:  [phone] ? ?

## 2021-09-07 NOTE — Assessment & Plan Note (Signed)
Smoking cessation strongly advised. Congratulated on cutting back; however, encouraged to avoid vaping as a replacement given the negative impacts this has on health. Has tried multiple things in the past with little success. Referred to smoking cessation program and LDCT program.  ?

## 2021-10-05 ENCOUNTER — Telehealth: Payer: Self-pay

## 2021-10-05 ENCOUNTER — Ambulatory Visit (INDEPENDENT_AMBULATORY_CARE_PROVIDER_SITE_OTHER): Payer: Managed Care, Other (non HMO)

## 2021-10-05 ENCOUNTER — Encounter: Payer: Self-pay | Admitting: Nurse Practitioner

## 2021-10-05 ENCOUNTER — Ambulatory Visit (INDEPENDENT_AMBULATORY_CARE_PROVIDER_SITE_OTHER): Payer: Managed Care, Other (non HMO) | Admitting: Nurse Practitioner

## 2021-10-05 VITALS — BP 132/78 | HR 73 | Temp 98.2°F | Ht 66.0 in | Wt 218.4 lb

## 2021-10-05 DIAGNOSIS — R0609 Other forms of dyspnea: Secondary | ICD-10-CM | POA: Diagnosis not present

## 2021-10-05 DIAGNOSIS — J189 Pneumonia, unspecified organism: Secondary | ICD-10-CM

## 2021-10-05 DIAGNOSIS — J432 Centrilobular emphysema: Secondary | ICD-10-CM

## 2021-10-05 DIAGNOSIS — G4733 Obstructive sleep apnea (adult) (pediatric): Secondary | ICD-10-CM

## 2021-10-05 DIAGNOSIS — Z720 Tobacco use: Secondary | ICD-10-CM

## 2021-10-05 MED ORDER — STIOLTO RESPIMAT 2.5-2.5 MCG/ACT IN AERS: 2.0000 | INHALATION_SPRAY | Freq: Every day | RESPIRATORY_TRACT | 5 refills | Status: DC

## 2021-10-05 NOTE — Progress Notes (Signed)
Reviewed and agree with assessment/plan.   Coralyn Helling, MD [facility] 10/05/2021, 9:40 AM Pager:  [phone]

## 2021-10-05 NOTE — Assessment & Plan Note (Signed)
Mild centrilobular emphysema on CT chest. Started on Stiolto at our last visit for DOE - does report good benefit from this. Will send rx today.  Patient Instructions  Continue Stiolto 2 puffs daily for 1 month Continue Albuterol inhaler 2 puffs every 6 hours as needed for shortness of breath or wheezing. Notify if symptoms persist despite rescue inhaler/neb use.   Continue CPAP nightly minimum of 4-6 hours. Change pressure to 10-18 cmH2O. Contact DME about your humidification/condensation if this persists despite pressure change. Let me know if you keep having despite change   Follow up in 3 months with Dr. Craige Cotta or Philis Nettle. If symptoms do not improve or worsen, please contact office for sooner follow up or seek emergency care.

## 2021-10-05 NOTE — Patient Instructions (Addendum)
Continue Stiolto 2 puffs daily for 1 month Continue Albuterol inhaler 2 puffs every 6 hours as needed for shortness of breath or wheezing. Notify if symptoms persist despite rescue inhaler/neb use.   Continue CPAP nightly minimum of 4-6 hours. Change pressure to 10-18 cmH2O. Contact DME about your humidification/condensation if this persists despite pressure change. Let me know if you keep having despite change   Follow up in 3 months with Dr. Craige Cotta or Philis Nettle. If symptoms do not improve or worsen, please contact office for sooner follow up or seek emergency care.

## 2021-10-05 NOTE — Assessment & Plan Note (Signed)
Smoking cessation strongly advised again. Suspect this is a major contributor to his daily cough. Has tried multiple things in the past with little success. Referred to smoking cessation program and LDCT program at previous visit.

## 2021-10-05 NOTE — Assessment & Plan Note (Signed)
Active smoker. Mild centrilobular emphysema and mild BTX on LDCT chest from October. Did have a mild restriction on previous PFTs, which could be related to his weight or possible underlying ILD? He has had improvement with Stiolto so shared decision to hold off on further ILD workup.

## 2021-10-05 NOTE — Assessment & Plan Note (Signed)
CAP diagnosed by PCP. Completed abx course almost 6 weeks ago. Clinically improved. Plans to repeat CXR today to ensure resolution.

## 2021-10-05 NOTE — Addendum Note (Signed)
Addended by: Gavin Potters R on: 10/05/2021 09:48 AM   Modules accepted: Orders

## 2021-10-05 NOTE — Progress Notes (Signed)
@Patient  ID: Stephen Sims, male    DOB: 02/12/1966, 56 y.o.   MRN: [id number]  Chief Complaint  Patient presents with   Follow-up    Snoring over machine, dry cough      Referring provider: Christain Sacramento, MD  HPI: 56 year old male, active smoker followed for OSA on CPAP and DOE. He is a patient of Dr. Juanetta Gosling and last seen in office on 09/29/2020. Past medical history significant for depression.  TEST/EVENTS:  09/29/2020 PFTs: FVC 74, FEV1 72, ratio 74, TLC 70, DLCOcor 103. Mild restrictive airway disease with normal diffusion capacity. No significant BD.  02/24/2021 LDCT chest (only read available and reviewed): benign calcified nodule in RUL; multiple scattered micronodules.There is mild centrilobular emphysema. Mild lower lobe BTX. No LAD. There is mild CAD.   09/29/2020: OV with Parrett,NP for follow up. Wearing CPAP nightly with good benefit. Active smoker - 1 ppd. Has tried chantix and wellbutrin in past without success and had significant side effects from Chantix. Reports DOE over the last several years. Completed PFTs which showed a mild restriction and normal DLCO. Recommended trial of albuterol. Referred to LDCT program.   09/07/2021: OV with Fayola Meckes NP for follow up with his wife. He was treated around 3 weeks ago for pneumonia and then suspected bronchitis by his PCP. He completed abx around a week and a half ago. Reports that he has been feeling better since. Cough has returned to his baseline, which is productive, primarily in the mornings, and his DOE is about how it normally is. Gets winded with incline walking, especially stairs.  Plan for repeat CXR in 4 weeks to ensure resolution. He did undergo a LDCT through his PCP in October. Results were reviewed today which showed mild centrilobular emphysema and some mild bronchiectasis. Trial Stiolto. He did have a mild restriction on his previous PFTs; no formal obstruction. Question weight vs possible ILD. 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 He also does carpentry work as a hobby and has cats at home. If no improvement with Stiolto, recommended further ILD workup. He continues to smoke 1/4 pack a day but has now taken up vaping as he thought this was better than smoking. He has tried Chantix in the past with negative side effects, nicotene patches without much success and Wellbutrin without much success. Referred to lung cancer screening program and smoking cessation program.   Regarding his CPAP, he continues to wear it every night without fail and receive good benefit from it. Wife reported snoring around the machine. His median pressures were around 12 cmH2O with max 15.7 so adjusted his settings to 10-18 cmH2O.  10/05/2021: Today - follow up Patient presents today with wife for follow up after being started on Stiolto. He reports feeling well. He has noticed a difference in his breathing with Stiolto and feels like his activity tolerance is better. He does continue to have a chronic cough which doesn't bother him much and is unchanged from his baseline. It is rarely productive with white sputum. He denies any wheezing, leg swelling, fevers, night sweats, hemoptysis, weight loss. He has not had to use his rescue inhaler recently. He is still smoking 1/4-1/2 pack per day and vaping occasionally.   They both report that he is still having issues with snoring around his CPAP. They don't think that his pressure settings ever were changed. He wears it nightly without fail.  09/05/2021-10/04/2021 AirView Download CPAP 5-16 cmH2O 30/30 days (100% >4 hours); av usage 8 hours 16 min Pressure median 12.7 cmH2O, max 15.8 Leaks median 0.4, max 25.6  AHI 2.8  Allergies  Allergen Reactions   Codeine    Erythromycin    Penicillins      There is no immunization history on file for this patient.  Past Medical History:  Diagnosis Date   Depression     Sleep apnea     Tobacco History: Social History   Tobacco Use  Smoking Status Every Day   Packs/day: 1.50   Years: 35.00   Pack years: 52.50   Types: Cigarettes  Smokeless Tobacco Never  Tobacco Comments   5 -  10 cigarettes smoked a day.  Vaping throughout the day.  10/05/21 ARJ    Ready to quit: Not Answered Counseling given: Not Answered Tobacco comments: 5 -  10 cigarettes smoked a day.  Vaping throughout the day.  10/05/21 ARJ    Outpatient Medications Prior to Visit  Medication Sig Dispense Refill   albuterol (VENTOLIN HFA) 108 (90 Base) MCG/ACT inhaler Inhale 1-2 puffs into the lungs every 6 (six) hours as needed for wheezing or shortness of breath. 8 g 2   amLODipine (NORVASC) 5 MG tablet TAKE 1 TABLET BY MOUTH EVERYDAY AT BEDTIME     venlafaxine (EFFEXOR) 100 MG tablet Take 1 tablet by mouth daily.     Tiotropium Bromide-Olodaterol (STIOLTO RESPIMAT) 2.5-2.5 MCG/ACT AERS Inhale 2 puffs into the lungs daily. 4 g 0   No facility-administered medications prior to visit.     Review of Systems:   Constitutional: No weight loss or gain, night sweats, fevers, chills, fatigue, or lassitude. HEENT: No headaches, difficulty swallowing, tooth/dental problems, or sore throat. No sneezing, itching, ear ache, nasal congestion, or post nasal drip CV:  No chest pain, orthopnea, PND, swelling in lower extremities, anasarca, dizziness, palpitations, syncope Resp: +shortness of breath with exertion (mainly incline walking/climbing; improved); chronic cough (baseline). No excess mucus or change in color of mucus. No hemoptysis. No wheezing.  No chest wall deformity GI:  No heartburn, indigestion, abdominal pain, nausea, vomiting, diarrhea, change in bowel habits, loss of appetite, bloody stools.  GU: No dysuria, change in color of urine, urgency or frequency.  No flank pain, no hematuria  Skin: No rash, lesions, ulcerations MSK:  No joint pain or swelling.  No decreased range of motion.  No  back pain. Neuro: No dizziness or lightheadedness.  Psych: No depression or anxiety. Mood stable.     Physical Exam:  BP 132/78 (BP Location: Right Arm, Patient Position: Sitting, Cuff Size: Normal)   Pulse 73   Temp 98.2 F (36.8 C) (Oral)   Ht 5\' 6"  (1.676 m)   Wt 218 lb 6.4 oz (99.1 kg)   SpO2 98%   BMI 35.25 kg/m   GEN: Pleasant, interactive, well-appearing; obese; in no acute distress. HEENT:  Normocephalic and atraumatic. PERRLA. Sclera white. Nasal turbinates pink, moist and patent bilaterally. No rhinorrhea present. Oropharynx pink and moist, without exudate or edema. No lesions, ulcerations, or postnasal drip.  NECK:  Supple w/ fair ROM. No JVD present. Normal carotid impulses w/o bruits. Thyroid symmetrical with no goiter or nodules palpated. No lymphadenopathy.   CV: RRR, no m/r/g, no peripheral edema. Pulses intact, +2 bilaterally. No cyanosis, pallor or clubbing. PULMONARY:  Unlabored, regular breathing. Clear bilaterally A&P w/o wheezes/rales/rhonchi. No accessory muscle use. No dullness to percussion. GI: BS present and normoactive. Soft, non-tender  to palpation. No organomegaly or masses detected. No CVA tenderness. MSK: No erythema, warmth or tenderness. Cap refil <2 sec all extrem. No deformities or joint swelling noted.  Neuro: A/Ox3. No focal deficits noted.   Skin: Warm, no lesions or rashe Psych: Normal affect and behavior. Judgement and thought content appropriate.     Lab Results:  CBC No results found for: WBC, RBC, HGB, HCT, PLT, MCV, MCH, MCHC, RDW, LYMPHSABS, MONOABS, EOSABS, BASOSABS  BMET No results found for: NA, K, CL, CO2, GLUCOSE, BUN, CREATININE, CALCIUM, GFRNONAA, GFRAA  BNP No results found for: BNP   Imaging:  No results found.       Latest Ref Rng & Units 09/29/2020    8:53 AM  PFT Results  FVC-Pre L 3.03    FVC-Predicted Pre % 72    FVC-Post L 3.10    FVC-Predicted Post % 74    Pre FEV1/FVC % % 75    Post FEV1/FCV % % 74     FEV1-Pre L 2.27    FEV1-Predicted Pre % 71    FEV1-Post L 2.30    DLCO uncorrected ml/min/mmHg 25.35    DLCO UNC% % 103    DLCO corrected ml/min/mmHg 25.35    DLCO COR %Predicted % 103    DLVA Predicted % 122    TLC L 4.28    TLC % Predicted % 70    RV % Predicted % 59      No results found for: NITRICOXIDE      Assessment & Plan:   Centrilobular emphysema (HCC) Mild centrilobular emphysema on CT chest. Started on Stiolto at our last visit for DOE - does report good benefit from this. Will send rx today.  Patient Instructions  Continue Stiolto 2 puffs daily for 1 month Continue Albuterol inhaler 2 puffs every 6 hours as needed for shortness of breath or wheezing. Notify if symptoms persist despite rescue inhaler/neb use.   Continue CPAP nightly minimum of 4-6 hours. Change pressure to 10-18 cmH2O. Contact DME about your humidification/condensation if this persists despite pressure change. Let me know if you keep having despite change   Follow up in 3 months with Dr. Halford Chessman or Alanson Aly. If symptoms do not improve or worsen, please contact office for sooner follow up or seek emergency care.    Exertional dyspnea Active smoker. Mild centrilobular emphysema and mild BTX on LDCT chest from October. Did have a mild restriction on previous PFTs, which could be related to his weight or possible underlying ILD? He has had improvement with Stiolto so shared decision to hold off on further ILD workup.   CAP (community acquired pneumonia) CAP diagnosed by PCP. Completed abx course almost 6 weeks ago. Clinically improved. Plans to repeat CXR today to ensure resolution.  OSA (obstructive sleep apnea) Excellent compliance. Breakthrough snoring. Residual AHI shows OSA well-controlled and without significant leaks. We ordered pressure change at his last visit but looks like this was never done. Based on his pressure use, we will adjust to 10-18 cmH2O and see if this helps.  Tobacco  user Smoking cessation strongly advised again. Suspect this is a major contributor to his daily cough. Has tried multiple things in the past with little success. Referred to smoking cessation program and LDCT program at previous visit.    I spent 28 minutes of dedicated to the care of this patient on the date of this encounter to include pre-visit review of records, face-to-face time with the patient discussing conditions above,  post visit ordering of testing, clinical documentation with the electronic health record, making appropriate referrals as documented, and communicating necessary findings to members of the patients care team.  Clayton Bibles, NP 10/05/2021  Pt aware and understands NP's role.

## 2021-10-05 NOTE — Assessment & Plan Note (Signed)
Excellent compliance. Breakthrough snoring. Residual AHI shows OSA well-controlled and without significant leaks. We ordered pressure change at his last visit but looks like this was never done. Based on his pressure use, we will adjust to 10-18 cmH2O and see if this helps.

## 2021-10-05 NOTE — Telephone Encounter (Signed)
ATC pt to determine where CXR was taken in April 2023. LVMTCB

## 2021-10-06 NOTE — Progress Notes (Signed)
Please notify patient that his CXR yesterday showed a hazy opacity in his right lung, which is likely resolving pneumonia or inflammation. Could also represent some scarring. Given his clinical improvement, we will plan to repeat CXR in 1 month. If it's still present, we will move forward with a CT scan for further evaluation. Please schedule him with me in 4 weeks with CXR before. Thanks!

## 2021-10-07 ENCOUNTER — Ambulatory Visit: Payer: Managed Care, Other (non HMO)

## 2021-10-07 ENCOUNTER — Other Ambulatory Visit: Payer: Self-pay

## 2021-10-07 DIAGNOSIS — J189 Pneumonia, unspecified organism: Secondary | ICD-10-CM

## 2021-10-07 NOTE — Telephone Encounter (Signed)
No problem. I think someone called him about his CXR results already. He was supposed to be scheduled for one month follow up though and it doesn't look like he's scheduled until Sept. Can you make sure this gets scheduled so we can repeat his CXR? Thanks!

## 2021-10-07 NOTE — Telephone Encounter (Signed)
Routing this info to [employer] as an FYI that pt is not sure where his prior cxr was done at.

## 2021-10-07 NOTE — Telephone Encounter (Signed)
Called and spoke with pt letting him know that [facility] wanted him to have an OV with cxr prior in 1 month and he verbalized understanding. Appt scheduled. Nothing further needed.

## 2021-10-10 ENCOUNTER — Ambulatory Visit (INDEPENDENT_AMBULATORY_CARE_PROVIDER_SITE_OTHER): Payer: 59 | Admitting: Psychologist

## 2021-10-10 DIAGNOSIS — F17200 Nicotine dependence, unspecified, uncomplicated: Secondary | ICD-10-CM | POA: Diagnosis not present

## 2021-10-10 DIAGNOSIS — F411 Generalized anxiety disorder: Secondary | ICD-10-CM | POA: Diagnosis not present

## 2021-10-10 NOTE — Plan of Care (Signed)

## 2021-10-10 NOTE — Progress Notes (Signed)
[facility] Counselor Initial Adult Exam  Name: Stephen Sims Date: 10/10/2021 MRN: [id number] DOB: 1965/07/14 PCP: Barbie Banner, MD  Time spent: 11:04 am to 11:26 am; total time: 22 minutes  This session was held via video webex teletherapy due to the coronavirus risk at this time. The patient consented to video teletherapy and was located at his home during this session. He is aware it is the responsibility of the patient to secure confidentiality on his end of the session. The provider was in a private home office for the duration of this session. Limits of confidentiality were discussed with the patient.   Guardian/Payee:  NA    Paperwork requested: No   Reason for Visit /Presenting Problem: Anxiety and smoking cessation  Mental Status Exam: Appearance:   Well Groomed     Behavior:  Appropriate  Motor:  Normal  Speech/Language:   Clear and Coherent  Affect:  Appropriate  Mood:  normal  Thought process:  normal  Thought content:    WNL  Sensory/Perceptual disturbances:    WNL  Orientation:  oriented to person, place, and time/date  Attention:  Good  Concentration:  Good  Memory:  WNL  Fund of knowledge:   Good  Insight:    Fair  Judgment:   Fair  Impulse Control:  Fair     Reported Symptoms:  The patient endorsed experiencing the following: racing thoughts, feeling on edge, feeling restless, and difficulty controlling worries. He denied suicidal and homicidal ideation.   The patient endorsed experiencing the following: unsuccessful efforts to cut back on smoking, a craving for smoking, tobacco use is continual despite knowledge of a physical problem, continued use despite experiencing interpersonal disputes with others about tobacco use.   Risk Assessment: Danger to Self:  No Self-injurious Behavior: No Danger to Others: No Duty to Warn:no Physical Aggression / Violence:No  Access to Firearms a concern: No  Gang Involvement:No  Patient / guardian  was educated about steps to take if suicide or homicide risk level increases between visits: n/a While future psychiatric events cannot be accurately predicted, the patient does not currently require acute inpatient psychiatric care and does not currently meet [facility] involuntary commitment criteria.  Substance Abuse History: Current substance abuse:  Patient stated that he is smoking and vaping daily. Per the patient, at one point he was smoking two to three packs daily of cigarettes. More recently he changed over to vaping and voiced vaping multiple times daily. He also stated that he consumes 18 beers a week drinking three to four times a week.      Past Psychiatric History:   Previous psychological history is significant for anxiety Outpatient Providers:NA History of Psych Hospitalization: No  Psychological Testing:  NA    Abuse History:  Victim of: No.,  NA    Report needed: No. Victim of Neglect:No. Perpetrator of  NA   Witness / Exposure to Domestic Violence: No   Protective Services Involvement: No  Witness to [employer] Violence:  No   Family History:  Family History  Problem Relation Age of Onset   Emphysema Father    Heart disease Father    COPD Father    Atrial fibrillation Father    Atrial fibrillation Mother     Living situation: the patient lives with their family  Sexual Orientation: Straight  Relationship Status: married  Name of spouse / other:[name]  If a parent, number of children / ages:Patient stated that he has two daughters. He  stated that he has an estranged relationship with his daughter, Lowanda Foster who is 55 years old. Patient also has a 62 year old daughter named Victory Dakin.   Support Systems: spouse  Surveyor, quantity Stress:  No   Income/Employment/Disability: Employment  Financial planner: No   Educational History: Education: some college  Religion/Sprituality/World View: Christian  Any cultural differences that may affect / interfere with  treatment:  not applicable   Recreation/Hobbies: Woodworking  Stressors: Other: Work stressors    Strengths: Supportive Relationships  Barriers:  NA   Legal History: Pending legal issue / charges: The patient has no significant history of legal issues. History of legal issue / charges: DWI  Medical History/Surgical History: reviewed Past Medical History:  Diagnosis Date   Depression    Sleep apnea     Past Surgical History:  Procedure Laterality Date   TONSILLECTOMY     WISDOM TOOTH EXTRACTION      Medications: Current Outpatient Medications  Medication Sig Dispense Refill   albuterol (VENTOLIN HFA) 108 (90 Base) MCG/ACT inhaler Inhale 1-2 puffs into the lungs every 6 (six) hours as needed for wheezing or shortness of breath. 8 g 2   amLODipine (NORVASC) 5 MG tablet TAKE 1 TABLET BY MOUTH EVERYDAY AT BEDTIME     Tiotropium Bromide-Olodaterol (STIOLTO RESPIMAT) 2.5-2.5 MCG/ACT AERS Inhale 2 puffs into the lungs daily. 4 g 5   venlafaxine (EFFEXOR) 100 MG tablet Take 1 tablet by mouth daily.     No current facility-administered medications for this visit.    Allergies  Allergen Reactions   Codeine    Erythromycin    Penicillins     Diagnoses: F41.1 generalized anxiety disorder and F17.200 tobacco use disorder, moderate  Plan of Care: The patient is a 56 year old Caucasian male who was referred due to experiencing anxiety and smoking cessation. The patient lives at home with his wife, daughter, three dogs, and two cats. The patient meets criteria for a diagnosis of F41.1 generalized anxiety disorder based off of the following:  racing thoughts, feeling on edge, feeling restless, and difficulty controlling worries. He denied suicidal and homicidal ideation. He also meets criteria for a diagnosis of F17.200 tobacco use disorder, moderate based off of the following: unsuccessful efforts to cut back on smoking, a craving for smoking, tobacco use is continual despite knowledge  of a physical problem, continued use despite experiencing interpersonal disputes with others about tobacco use. It is also possible that the patient may meet criteria for a diagnosis of alcohol abuse, however additional information is needed.   The patient stated that he wants coping skills.  This psychologist makes the recommendation that the patient participate in therapy at least once a month. If possible bi-weekly to assist in meeting his needs.    Hilbert Corrigan, PsyD

## 2021-10-10 NOTE — Progress Notes (Signed)
                Stephen Hartney, PsyD 

## 2021-11-02 ENCOUNTER — Ambulatory Visit: Payer: 59 | Admitting: Psychologist

## 2021-11-07 ENCOUNTER — Ambulatory Visit (INDEPENDENT_AMBULATORY_CARE_PROVIDER_SITE_OTHER): Payer: Managed Care, Other (non HMO) | Admitting: Nurse Practitioner

## 2021-11-07 ENCOUNTER — Encounter: Payer: Self-pay | Admitting: Nurse Practitioner

## 2021-11-07 ENCOUNTER — Ambulatory Visit (INDEPENDENT_AMBULATORY_CARE_PROVIDER_SITE_OTHER): Payer: Managed Care, Other (non HMO)

## 2021-11-07 VITALS — BP 130/82 | HR 83 | Temp 98.4°F | Ht 66.0 in | Wt 219.4 lb

## 2021-11-07 DIAGNOSIS — G4733 Obstructive sleep apnea (adult) (pediatric): Secondary | ICD-10-CM | POA: Diagnosis not present

## 2021-11-07 DIAGNOSIS — J189 Pneumonia, unspecified organism: Secondary | ICD-10-CM | POA: Diagnosis not present

## 2021-11-07 DIAGNOSIS — F17210 Nicotine dependence, cigarettes, uncomplicated: Secondary | ICD-10-CM | POA: Diagnosis not present

## 2021-11-07 DIAGNOSIS — J432 Centrilobular emphysema: Secondary | ICD-10-CM | POA: Diagnosis not present

## 2021-11-07 DIAGNOSIS — R0609 Other forms of dyspnea: Secondary | ICD-10-CM

## 2021-11-07 DIAGNOSIS — Z720 Tobacco use: Secondary | ICD-10-CM

## 2021-11-07 MED ORDER — NICOTINE 21 MG/24HR TD PT24: 21.0000 mg | MEDICATED_PATCH | Freq: Every day | TRANSDERMAL | 0 refills | Status: AC

## 2021-11-07 MED ORDER — NICOTINE 7 MG/24HR TD PT24: 7.0000 mg | MEDICATED_PATCH | Freq: Every day | TRANSDERMAL | 0 refills | Status: AC

## 2021-11-07 MED ORDER — NICOTINE 14 MG/24HR TD PT24: 14.0000 mg | MEDICATED_PATCH | Freq: Every day | TRANSDERMAL | 0 refills | Status: AC

## 2021-11-07 NOTE — Assessment & Plan Note (Addendum)
Mild centrilobular emphysema on CT chest. Improvement after starting on Stiolto; will continue this. Smoking cessation strongly encouraged.

## 2021-11-07 NOTE — Assessment & Plan Note (Addendum)
Excellent compliance. Breakthrough snoring. Residual AHI relatively unchanged with pressure adjustment. He would like to make further adjustments to see if this corrects the condensation and snoring. Based on his pressure use, we will adjust to 12-20 cmH2O and see if this helps.   Patient Instructions  Continue Stiolto 2 puffs daily for 1 month Continue Albuterol inhaler 2 puffs every 6 hours as needed for shortness of breath or wheezing. Notify if symptoms persist despite rescue inhaler/neb use.   Continue CPAP nightly minimum of 4-6 hours. Change pressure to 12-20 cmH2O.   Chest x ray clear today!  We discussed quitting smoking today. Aim for your quit date to be on or around 8/14.  Nicotine patches 21 mg, apply patch daily for 6 weeks. Then decreased to 14 mg patch daily for 2 weeks then decrease to 7 mg patch daily for 2 weeks.    Follow up in 3 months with Dr. Craige Cotta or Philis Nettle. If symptoms do not improve or worsen, please contact office for sooner follow up or seek emergency care.

## 2021-11-07 NOTE — Assessment & Plan Note (Addendum)
He will be followed by the lung cancer screening program.  The patient's current tobacco use: 0.25-0.5 ppd The patient was advised to quit and impact of smoking on their health.  I assessed the patient's willingness to attempt to quit. I provided methods and skills for cessation. We reviewed medication management of smoking session drugs if appropriate. Resources to help quit smoking were provided. A smoking cessation quit date was set: December 12, 2021. Nicotine patches provided.  Follow-up was arranged in our clinic.  The amount of time spent counseling patient was 5 mins

## 2021-11-07 NOTE — Patient Instructions (Addendum)
Continue Stiolto 2 puffs daily for 1 month Continue Albuterol inhaler 2 puffs every 6 hours as needed for shortness of breath or wheezing. Notify if symptoms persist despite rescue inhaler/neb use.   Continue CPAP nightly minimum of 4-6 hours. Change pressure to 12-20 cmH2O.   Chest x ray clear today!  We discussed quitting smoking today. Aim for your quit date to be on or around 8/14.  Nicotine patches 21 mg, apply patch daily for 6 weeks. Then decreased to 14 mg patch daily for 2 weeks then decrease to 7 mg patch daily for 2 weeks.    Follow up in 3 months with Dr. Craige Cotta or Philis Nettle. If symptoms do not improve or worsen, please contact office for sooner follow up or seek emergency care.

## 2021-11-07 NOTE — Assessment & Plan Note (Signed)
Active smoker.Mild centrilobular emphysema and mild BTX on LDCT chest from October. Did have a mild restriction on previous PFTs, which could be related to his weight or possible underlying ILD? He has had improvement with Stiolto so shared decision at previous OV to hold off on further ILD workup.

## 2021-11-07 NOTE — Assessment & Plan Note (Signed)
Clinical and radiographic improvement. Previous opacity has completed resolved and lungs are clear.

## 2021-11-07 NOTE — Progress Notes (Signed)
@Patient  ID: Stephen Sims, male    DOB: 1965/07/13, 56 y.o.   MRN: [id number]  Chief Complaint  Patient presents with   Follow-up    He is doing well and feels that he is doing better since last OV.     Referring provider: [id number], MD  HPI: 56 year old male, active smoker followed for OSA on CPAP and emphysema. He is a patient of Dr. 58 and last seen in office on 10/05/2021. Past medical history significant for depression.  TEST/EVENTS:  09/29/2020 PFTs: FVC 74, FEV1 72, ratio 74, TLC 70, DLCOcor 103. Mild restrictive airway disease with normal diffusion capacity. No significant BD.  02/24/2021 LDCT chest (only read available and reviewed): benign calcified nodule in RUL; multiple scattered micronodules.There is mild centrilobular emphysema. Mild lower lobe BTX. No LAD. There is mild CAD.   09/29/2020: OV with Parrett,NP for follow up. Wearing CPAP nightly with good benefit. Active smoker - 1 ppd. Has tried chantix and wellbutrin in past without success and had significant side effects from Chantix. Reports DOE over the last several years. Completed PFTs which showed a mild restriction and normal DLCO. Recommended trial of albuterol. Referred to LDCT program.   09/07/2021: OV with Stephen Dzik NP for follow up with his wife. He was treated around 3 weeks ago for pneumonia and then suspected bronchitis by his PCP. He completed abx around a week and a half ago. Reports that he has been feeling better since. Cough has returned to his baseline, which is productive, primarily in the mornings, and his DOE is about how it normally is. Gets winded with incline walking, especially stairs.  Plan for repeat CXR in 4 weeks to ensure resolution. He did undergo a LDCT through his PCP in October. Results were reviewed today which showed mild centrilobular emphysema and some mild bronchiectasis. Trial Stiolto. He did have a mild restriction on his previous PFTs; no formal obstruction. Question weight vs possible  ILD. 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 He also does carpentry work as a hobby and has cats at home. If no improvement with Stiolto, recommended further ILD workup. He continues to smoke 1/4 pack a day but has now taken up vaping as he thought this was better than smoking. He has tried Chantix in the past with negative side effects, nicotine patches without much success and Wellbutrin without much success. Referred to lung cancer screening program and smoking cessation program.   Regarding his CPAP, he continues to wear it every night without fail and receive good benefit from it. Wife reported snoring around the machine. His median pressures were around 12 cmH2O with max 15.7 so adjusted his settings to 10-18 cmH2O.  10/05/2021: OV with Stephen Alegria NP for follow up after being started on Stiolto. He reports feeling well. He has noticed a difference in his breathing with Stiolto and feels like his activity tolerance is better. He does continue to have a chronic cough which doesn't bother him much and is unchanged from his baseline. It is rarely productive with white sputum. He denies any wheezing, leg swelling, fevers, night sweats, hemoptysis, weight loss. He has not had to use his rescue inhaler recently. He is still smoking 1/4-1/2 pack per day and vaping occasionally. Clinically improved since being treated for pna. CXR showed a hazy right perihilar opacity, unable to compare to previous imaging. Likely resolving infection. Recommended rechecking in one month and  if persistent, obtain CT chest.   They both report that he is still having issues with snoring around his CPAP. They don't think that his pressure settings ever were changed. He wears it nightly without fail. Upon review of his download, his pressures were never adjusted. AHI 2.8. Sent order for them to be increased to 10-18 cmH2O again.   11/07/2021: Today - follow  up Patient presents today for follow up. His chest x ray today is clear with complete resolution of previous opacity. He reports that he has been feeling well, from a respiratory standpoint. Feels like the Stiolto has allowed him to complete activities and walk further without getting as winded. Cough is stable and unchanged from baseline. He does continue to smoke 1/4-1/2 ppd but he is wanting to quit. He has tried multiple things in the past without success, but also feels like the drive to quit wasn't present.   Regarding his CPAP, he feels like he is sleeping better with it. He will still occasionally have some condensation in his tube but feels like it is not as bad as it was previously. His wife did tell him that his snoring is better but she will still hear him snore around his CPAP occasionally. He denies morning headaches or drowsy driving.   10/05/2021-11/03/2021: CPAP 10-18 cmH2O 30/30 days; 100% >4 hr; av use 8 hr 13 min Pressure median 13.9, 95th 17.5 Leaks median 0.3, 95th 11.2 AHI 2.4  Allergies  Allergen Reactions   Penicillins Other (See Comments)    Per patient ate through his brain,    Codeine Nausea Only   Erythromycin Rash     There is no immunization history on file for this patient.  Past Medical History:  Diagnosis Date   Depression    Sleep apnea     Tobacco History: Social History   Tobacco Use  Smoking Status Every Day   Packs/day: 1.50   Years: 35.00   Total pack years: 52.50   Types: Cigarettes  Smokeless Tobacco Never  Tobacco Comments   1/2 ppd 11/07/21. Hsm.    Ready to quit: Not Answered Counseling given: Not Answered Tobacco comments: 1/2 ppd 11/07/21. Hsm.    Outpatient Medications Prior to Visit  Medication Sig Dispense Refill   albuterol (VENTOLIN HFA) 108 (90 Base) MCG/ACT inhaler Inhale 1-2 puffs into the lungs every 6 (six) hours as needed for wheezing or shortness of breath. 8 g 2   amLODipine (NORVASC) 5 MG tablet TAKE 1 TABLET  BY MOUTH EVERYDAY AT BEDTIME     Tiotropium Bromide-Olodaterol (STIOLTO RESPIMAT) 2.5-2.5 MCG/ACT AERS Inhale 2 puffs into the lungs daily. 4 g 5   venlafaxine (EFFEXOR) 100 MG tablet Take 1 tablet by mouth daily.     No facility-administered medications prior to visit.     Review of Systems:   Constitutional: No weight loss or gain, night sweats, fevers, chills, fatigue, or lassitude. HEENT: No headaches, difficulty swallowing, tooth/dental problems, or sore throat. No sneezing, itching, ear ache, nasal congestion, or post nasal drip CV:  No chest pain, orthopnea, PND, swelling in lower extremities, anasarca, dizziness, palpitations, syncope Resp: +shortness of breath with exertion (mainly incline walking/climbing; improved); chronic cough (baseline); snoring. No excess mucus or change in color of mucus. No hemoptysis. No wheezing.  No chest wall deformity Skin: No rash, lesions, ulcerations MSK:  No joint pain or swelling.  No decreased range of motion.  No back pain. Neuro: No dizziness or lightheadedness.  Psych: No depression or  anxiety. Mood stable.     Physical Exam:  BP 130/82 (BP Location: Left Arm, Cuff Size: Normal)   Pulse 83   Temp 98.4 F (36.9 C) (Oral)   Ht 5\' 6"  (1.676 m)   Wt 219 lb 6.4 oz (99.5 kg)   SpO2 96%   BMI 35.41 kg/m   GEN: Pleasant, interactive, well-appearing; obese; in no acute distress. HEENT:  Normocephalic and atraumatic. PERRLA. Sclera white. Nasal turbinates pink, moist and patent bilaterally. No rhinorrhea present. Oropharynx pink and moist, without exudate or edema. No lesions, ulcerations, or postnasal drip.  NECK:  Supple w/ fair ROM. No JVD present. Normal carotid impulses w/o bruits. Thyroid symmetrical with no goiter or nodules palpated. No lymphadenopathy.   CV: RRR, no m/r/g, no peripheral edema. Pulses intact, +2 bilaterally. No cyanosis, pallor or clubbing. PULMONARY:  Unlabored, regular breathing. Clear bilaterally A&P w/o  wheezes/rales/rhonchi. No accessory muscle use. No dullness to percussion. GI: BS present and normoactive. Soft, non-tender to palpation. No organomegaly or masses detected. No CVA tenderness. MSK: No erythema, warmth or tenderness. Cap refil <2 sec all extrem. No deformities or joint swelling noted.  Neuro: A/Ox3. No focal deficits noted.   Skin: Warm, no lesions or rashe Psych: Normal affect and behavior. Judgement and thought content appropriate.     Lab Results:  CBC No results found for: "WBC", "RBC", "HGB", "HCT", "PLT", "MCV", "MCH", "MCHC", "RDW", "LYMPHSABS", "MONOABS", "EOSABS", "BASOSABS"  BMET No results found for: "NA", "K", "CL", "CO2", "GLUCOSE", "BUN", "CREATININE", "CALCIUM", "GFRNONAA", "GFRAA"  BNP No results found for: "BNP"   Imaging:  DG Chest 2 View  Result Date: 11/07/2021 CLINICAL DATA:  Opacity of lung; CAP EXAM: CHEST - 2 VIEW COMPARISON:  10/05/2021. FINDINGS: Cardiac silhouette is normal in size and configuration. No mediastinal or hilar masses or evidence of adenopathy. Lungs are clear. Perihilar opacity noted previously is no longer evident. No pleural effusion or pneumothorax. Skeletal structures are intact. IMPRESSION: No active cardiopulmonary disease. Electronically Signed   By: 12/05/2021 M.D.   On: 11/07/2021 08:58         Latest Ref Rng & Units 09/29/2020    8:53 AM  PFT Results  FVC-Pre L 3.03   FVC-Predicted Pre % 72   FVC-Post L 3.10   FVC-Predicted Post % 74   Pre FEV1/FVC % % 75   Post FEV1/FCV % % 74   FEV1-Pre L 2.27   FEV1-Predicted Pre % 71   FEV1-Post L 2.30   DLCO uncorrected ml/min/mmHg 25.35   DLCO UNC% % 103   DLCO corrected ml/min/mmHg 25.35   DLCO COR %Predicted % 103   DLVA Predicted % 122   TLC L 4.28   TLC % Predicted % 70   RV % Predicted % 59     No results found for: "NITRICOXIDE"      Assessment & Plan:   OSA (obstructive sleep apnea) Excellent compliance. Breakthrough snoring. Residual AHI  relatively unchanged with pressure adjustment. He would like to make further adjustments to see if this corrects the condensation and snoring. Based on his pressure use, we will adjust to 12-20 cmH2O and see if this helps.   Patient Instructions  Continue Stiolto 2 puffs daily for 1 month Continue Albuterol inhaler 2 puffs every 6 hours as needed for shortness of breath or wheezing. Notify if symptoms persist despite rescue inhaler/neb use.   Continue CPAP nightly minimum of 4-6 hours. Change pressure to 12-20 cmH2O.   Chest x ray clear  today!  We discussed quitting smoking today. Aim for your quit date to be on or around 8/14.  Nicotine patches 21 mg, apply patch daily for 6 weeks. Then decreased to 14 mg patch daily for 2 weeks then decrease to 7 mg patch daily for 2 weeks.    Follow up in 3 months with Dr. Craige Cotta or Philis Nettle. If symptoms do not improve or worsen, please contact office for sooner follow up or seek emergency care.    Centrilobular emphysema (HCC) Mild centrilobular emphysema on CT chest. Improvement after starting on Stiolto; will continue this. Smoking cessation strongly encouraged.  CAP (community acquired pneumonia) Clinical and radiographic improvement. Previous opacity has completed resolved and lungs are clear.   Exertional dyspnea Active smoker. Mild centrilobular emphysema and mild BTX on LDCT chest from October. Did have a mild restriction on previous PFTs, which could be related to his weight or possible underlying ILD? He has had improvement with Stiolto so shared decision at previous OV to hold off on further ILD workup.   Tobacco user He will be followed by the lung cancer screening program.  The patient's current tobacco use: 0.25-0.5 ppd The patient was advised to quit and impact of smoking on their health.  I assessed the patient's willingness to attempt to quit. I provided methods and skills for cessation. We reviewed medication management of  smoking session drugs if appropriate. Resources to help quit smoking were provided. A smoking cessation quit date was set: December 12, 2021. Nicotine patches provided.  Follow-up was arranged in our clinic.  The amount of time spent counseling patient was 5 mins    I spent 32 minutes of dedicated to the care of this patient on the date of this encounter to include pre-visit review of records, face-to-face time with the patient discussing conditions above, post visit ordering of testing, clinical documentation with the electronic health record, making appropriate referrals as documented, and communicating necessary findings to members of the patients care team.  Noemi Chapel, NP 11/07/2021  Pt aware and understands NP's role.

## 2021-11-07 NOTE — Progress Notes (Signed)
Reviewed and agree with assessment/plan.   Kaysi Ourada, MD [facility] 11/07/2021, 12:32 PM Pager:  [phone]  

## 2021-12-17 ENCOUNTER — Other Ambulatory Visit: Payer: Self-pay | Admitting: Nurse Practitioner

## 2021-12-17 DIAGNOSIS — Z720 Tobacco use: Secondary | ICD-10-CM

## 2021-12-19 NOTE — Telephone Encounter (Signed)
Please advise on refill request

## 2021-12-19 NOTE — Telephone Encounter (Signed)
Was this patient requested or pharmacy? I sent a taper for him at his last visit. There should be a rx available for the 14 mg patches starting today.

## 2021-12-20 NOTE — Telephone Encounter (Signed)
This was a refill request that was sent by the pharmacy. I do see the Rx for nicotine 14mg  patches that had been sent so will deny this request.

## 2022-01-09 ENCOUNTER — Ambulatory Visit: Payer: Managed Care, Other (non HMO) | Admitting: Pulmonary Disease

## 2022-02-20 ENCOUNTER — Encounter: Payer: Self-pay | Admitting: Pulmonary Disease

## 2022-02-20 ENCOUNTER — Ambulatory Visit: Payer: Managed Care, Other (non HMO) | Admitting: Pulmonary Disease

## 2022-02-20 VITALS — BP 128/64 | HR 96 | Temp 98.5°F | Ht 65.0 in | Wt 216.6 lb

## 2022-02-20 DIAGNOSIS — J432 Centrilobular emphysema: Secondary | ICD-10-CM

## 2022-02-20 DIAGNOSIS — Z720 Tobacco use: Secondary | ICD-10-CM | POA: Diagnosis not present

## 2022-02-20 DIAGNOSIS — G4733 Obstructive sleep apnea (adult) (pediatric): Secondary | ICD-10-CM | POA: Diagnosis not present

## 2022-02-20 NOTE — Patient Instructions (Signed)
Will arrange for CPAP titration study.  Try wrapping your CPAP tube in a towel to help insulate it.  Please have your primary care doctor send a copy of your CT chest scan to our office.  Follow up in 6 months.

## 2022-02-20 NOTE — Progress Notes (Signed)
[facility]  Chief Complaint  Patient presents with   Follow-up    Still snores through mask    Constitutional:  BP 128/64 (BP Location: Left Arm, Patient Position: Sitting, Cuff Size: Normal)   Pulse 96   Temp 98.5 F (36.9 C) (Oral)   Ht 5\' 5"  (1.651 m)   Wt 216 lb 9.6 oz (98.2 kg)   SpO2 96%   BMI 36.04 kg/m   Past Medical History:  Depression  Past Surgical History:  He  has a past surgical history that includes Tonsillectomy and Wisdom tooth extraction.  Brief Summary:  Stephen Sims is a 56 y.o. male smoker with COPD and obstructive sleep apnea.      Subjective:   He is here with his wife.  His PFT from June 2022 showed mild COPD.  He still smokes cigarettes.  He wants to use nicotine patch, he has a lot of stress at work.  He has occasional cough.  Doesn't feel like inhalers help much, but he uses then a couple times per week.  He gets winded when doing strenuous activity, but not with routine activity.  He thinks he had low dose CT through his PCP last year.  He uses CPAP nightly.  His wife says he still snores and will kick around at night.  He has been feeling more tired during the day.  He has trouble with water building up in his mask.   Physical Exam:   Appearance - well kempt   ENMT - no sinus tenderness, no oral exudate, no LAN, Mallampati 3 airway, no stridor  Respiratory - equal breath sounds bilaterally, no wheezing or rales  CV - s1s2 regular rate and rhythm, no murmurs  Ext - no clubbing, no edema  Skin - no rashes  Psych - normal mood and affect    Pulmonary testing:  PFT 09/29/20 >> FEV1 2.30 (72%), FEV1% 74, TLC 4.28 (70%), DLCO 103%  Chest Imaging:    Sleep Tests:  Auto CPAP 01/18/22 to 02/16/22 >> used on 30 of 30 nights with average 8 hrs 17 min.  Average AHI 1.4 with median CPAP 15 and 95 th percentile CPAP 19 cm H2O  Social History:  He  reports that he has been smoking  cigarettes. He has a 52.50 pack-year smoking history. He has never used smokeless tobacco. He reports current alcohol use. He reports that he does not use drugs.  Family History:  His family history includes Atrial fibrillation in his father and mother; COPD in his father; Emphysema in his father; Heart disease in his father.     Assessment/Plan:   COPD with emphysema. - mild - he can stop stiolto - continue albuterol prn   Tobacco abuse. - tried chantix and wellbutrin before w/o success - discussed other options to help with smoking cessation - he plans to try nicotine patch - asked him to send a copy of his low dose CT chest scan from his PCP office; will then have future testing set up through our office at his request   Obstructive sleep apnea. - he is compliant with CPAP and reports benefit - he uses [facility] for his DME - current CPAP ordered July 2023 - he has trouble with persistent daytime sleepiness, air leak, persistent snoring, and rain out - discussed options to alleviate ran out - he is already using auto CPAP 12 to 20 cm H2O and his average pressure settings are relatively high -  will arrange for a CPAP titration study; he might need to be transitioned to Bipap  Time Spent Involved in Patient Care on Day of Examination:  36 minutes  Follow up:   Patient Instructions  Will arrange for CPAP titration study.  Try wrapping your CPAP tube in a towel to help insulate it.  Please have your primary care doctor send a copy of your CT chest scan to our office.  Follow up in 6 months.  Medication List:   Allergies as of 02/20/2022       Reactions   Penicillins Other (See Comments)   Per patient ate through his brain,    Codeine Nausea Only   Erythromycin Rash        Medication List        Accurate as of February 20, 2022  9:24 AM. If you have any questions, ask your nurse or doctor.          STOP taking these medications    Stiolto Respimat 2.5-2.5  MCG/ACT Aers Generic drug: Tiotropium Bromide-Olodaterol Stopped by: Stephen Helling, MD       TAKE these medications    albuterol 108 (90 Base) MCG/ACT inhaler Commonly known as: VENTOLIN HFA Inhale 1-2 puffs into the lungs every 6 (six) hours as needed for wheezing or shortness of breath.   amLODipine 5 MG tablet Commonly known as: NORVASC TAKE 1 TABLET BY MOUTH EVERYDAY AT BEDTIME   venlafaxine 100 MG tablet Commonly known as: EFFEXOR Take 1 tablet by mouth daily.        Signature:  Stephen Helling, MD [facility] Pager - [phone] 02/20/2022, 9:24 AM

## 2022-03-08 ENCOUNTER — Encounter (HOSPITAL_BASED_OUTPATIENT_CLINIC_OR_DEPARTMENT_OTHER): Payer: Self-pay | Admitting: Pulmonary Disease

## 2022-09-19 ENCOUNTER — Other Ambulatory Visit: Payer: Self-pay | Admitting: *Deleted

## 2022-09-19 DIAGNOSIS — F17210 Nicotine dependence, cigarettes, uncomplicated: Secondary | ICD-10-CM

## 2022-09-19 DIAGNOSIS — Z87891 Personal history of nicotine dependence: Secondary | ICD-10-CM

## 2022-09-19 DIAGNOSIS — Z122 Encounter for screening for malignant neoplasm of respiratory organs: Secondary | ICD-10-CM

## 2022-09-26 ENCOUNTER — Encounter (HOSPITAL_BASED_OUTPATIENT_CLINIC_OR_DEPARTMENT_OTHER): Payer: Self-pay | Admitting: Pulmonary Disease

## 2022-09-26 ENCOUNTER — Ambulatory Visit (HOSPITAL_BASED_OUTPATIENT_CLINIC_OR_DEPARTMENT_OTHER): Payer: BC Managed Care – PPO | Admitting: Pulmonary Disease

## 2022-09-26 VITALS — BP 154/76 | HR 89 | Temp 98.5°F | Ht 66.0 in | Wt 214.2 lb

## 2022-09-26 DIAGNOSIS — Z720 Tobacco use: Secondary | ICD-10-CM

## 2022-09-26 DIAGNOSIS — G4733 Obstructive sleep apnea (adult) (pediatric): Secondary | ICD-10-CM

## 2022-09-26 DIAGNOSIS — G4761 Periodic limb movement disorder: Secondary | ICD-10-CM

## 2022-09-26 DIAGNOSIS — J432 Centrilobular emphysema: Secondary | ICD-10-CM | POA: Diagnosis not present

## 2022-09-26 MED ORDER — ALBUTEROL SULFATE HFA 108 (90 BASE) MCG/ACT IN AERS: 2.0000 | INHALATION_SPRAY | Freq: Four times a day (QID) | RESPIRATORY_TRACT | 2 refills | Status: DC | PRN

## 2022-09-26 NOTE — Progress Notes (Signed)
[place] [facility]  Chief Complaint  Patient presents with   Follow-up    Follow up. Patient stated he is still not fully rested with his machine. He is still snoring with it and dealing with restless legs.     Constitutional:  BP (!) 154/76 (BP Location: Left Arm, Patient Position: Sitting, Cuff Size: Normal)   Pulse 89   Temp 98.5 F (36.9 C) (Oral)   Ht 5\' 6"  (1.676 m)   Wt 214 lb 3.2 oz (97.2 kg)   SpO2 99%   BMI 34.57 kg/m   Past Medical History:  Depression, Hypertension  Past Surgical History:  He  has a past surgical history that includes Tonsillectomy and Wisdom tooth extraction.  Brief Summary:  Stephen Sims is a 57 y.o. male smoker with COPD and obstructive sleep apnea.      Subjective:   He is here with his wife.  He still is smoking cigarettes.  Feels too much stress at work.  He works [employer].  His wife says he is coughing more.  He feels like he should use albuterol more but ran out.    His wife says he snores and kicks his legs while asleep.  He is not aware of doing this.  She has noticed he sometimes punches or talks as if he is fighting in his sleep and acting out his dream.  He hasn't not sustained any injuries from this.  Happens a few times per month.     Physical Exam:   Appearance - well kempt   ENMT - no sinus tenderness, no oral exudate, no LAN, Mallampati 3 airway, no stridor  Respiratory - equal breath sounds bilaterally, no wheezing or rales  CV - s1s2 regular rate and rhythm, no murmurs  Ext - no clubbing, no edema  Skin - no rashes  Psych - normal mood and affect    Pulmonary testing:  PFT 09/29/20 >> FEV1 2.30 (72%), FEV1% 74, TLC 4.28 (70%), DLCO 103%  Chest Imaging:  LDCT chest 02/24/21 >> calcified nodule RUL, multiple micronodules, mild centrilobular emphysema, mild lower lobe BTX, mild coronary atherosclerosis  Sleep Tests:  Auto CPAP 08/27/22 to 09/25/22  >> used on 30 of 30 nights with average 8 hrs 13 min.  Average AHI 1.9 with median CPAP 15 and 95 th percentile CPAP 19 cm H2O  Social History:  He  reports that he has been smoking cigarettes. He has a 52.50 pack-year smoking history. He has never used smokeless tobacco. He reports current alcohol use. He reports that he does not use drugs.  Family History:  His family history includes Atrial fibrillation in his father and mother; COPD in his father; Emphysema in his father; Heart disease in his father.     Assessment/Plan:   COPD with emphysema. - will refill his albuterol - discussed symptoms to monitor for that would indicate a needed for augmenting inhaler therapies   Tobacco abuse. - tried chantix and wellbutrin before w/o success - discussed other options to help with smoking cessation - follow up low dose CT chest scheduled for October 18, 2022   Obstructive sleep apnea. - he is compliant with CPAP and reports benefit - he uses [facility] for his DME - current CPAP ordered July 2023 - he continues to have trouble with persistent daytime sleepiness, air leak, persistent snoring, and rain out - he is already using auto CPAP 12 to 20 cm H2O and his average  pressure settings are relatively high - previous attempt to schedule CPAP titration study was denied by insurance, but he has new insurance - his wife's description of his sleep also raises concern for periodic limb movement and/or REM sleep behavior disorder - will try to reorder CPAP titration study   Time Spent Involved in Patient Care on Day of Examination:  36 minutes  Follow up:   Patient Instructions  Will call with results of CT chest and sleep study  Follow up in 6 months  Medication List:   Allergies as of 09/26/2022       Reactions   Penicillins Other (See Comments)   Per patient ate through his brain,    Codeine Nausea Only   Erythromycin Rash        Medication List        Accurate as of Sep 26, 2022  8:45 AM. If you have any questions, ask your nurse or doctor.          albuterol 108 (90 Base) MCG/ACT inhaler Commonly known as: VENTOLIN HFA Inhale 2 puffs into the lungs every 6 (six) hours as needed for wheezing or shortness of breath. What changed: how much to take Changed by: Coralyn Helling, MD   amLODipine 5 MG tablet Commonly known as: NORVASC TAKE 1 TABLET BY MOUTH EVERYDAY AT BEDTIME   venlafaxine 100 MG tablet Commonly known as: EFFEXOR Take 1 tablet by mouth daily.        Signature:  Coralyn Helling, MD [facility]/Critical Care Pager - [phone] 09/26/2022, 8:45 AM

## 2022-09-26 NOTE — Patient Instructions (Signed)
Will call with results of CT chest and sleep study  Follow up in 6 months

## 2022-10-17 ENCOUNTER — Ambulatory Visit (INDEPENDENT_AMBULATORY_CARE_PROVIDER_SITE_OTHER): Payer: BC Managed Care – PPO | Admitting: Primary Care

## 2022-10-17 ENCOUNTER — Encounter: Payer: Self-pay | Admitting: Primary Care

## 2022-10-17 DIAGNOSIS — F17210 Nicotine dependence, cigarettes, uncomplicated: Secondary | ICD-10-CM

## 2022-10-17 DIAGNOSIS — F17200 Nicotine dependence, unspecified, uncomplicated: Secondary | ICD-10-CM

## 2022-10-17 NOTE — Patient Instructions (Signed)
Thank you for participating in the [facility]. It was our pleasure to meet you today. We will call you with the results of your scan within the next few days. Your scan will be assigned a Lung RADS category score by the physicians reading the scans.  This Lung RADS score determines follow up scanning.  See below for description of categories, and follow up screening recommendations. We will be in touch to schedule your follow up screening annually or based on recommendations of our providers. We will fax a copy of your scan results to your Primary Care Physician, or the physician who referred you to the program, to ensure they have the results. Please call the office if you have any questions or concerns regarding your scanning experience or results.  Our office number is [phone]. Please speak with Denise Phelps, RN. , or  Denise Buckner RN, They are  our Lung Cancer Screening RN.'s If They are unavailable when you call, Please leave a message on the voice mail. We will return your call at our earliest convenience.This voice mail is monitored several times a day.  Remember, if your scan is normal, we will scan you annually as long as you continue to meet the criteria for the program. (Age 50-80, Current smoker or smoker who has quit within the last 15 years). If you are a smoker, remember, quitting is the single most powerful action that you can take to decrease your risk of lung cancer and other pulmonary, breathing related problems. We know quitting is hard, and we are here to help.  Please let us know if there is anything we can do to help you meet your goal of quitting. If you are a former smoker, congratulations. We are proud of you! Remain smoke free! Remember you can refer friends or family members through the number above.  We will screen them to make sure they meet criteria for the program. Thank you for helping us take better care of you by  participating in Lung Screening.  You can receive free nicotine replacement therapy ( patches, gum or mints) by calling 1-800-QUIT NOW. Please call so we can get you on the path to becoming  a non-smoker. I know it is hard, but you can do this!  Lung RADS Categories:  Lung RADS 1: no nodules or definitely non-concerning nodules.  Recommendation is for a repeat annual scan in 12 months.  Lung RADS 2:  nodules that are non-concerning in appearance and behavior with a very low likelihood of becoming an active cancer. Recommendation is for a repeat annual scan in 12 months.  Lung RADS 3: nodules that are probably non-concerning , includes nodules with a low likelihood of becoming an active cancer.  Recommendation is for a 6-month repeat screening scan. Often noted after an upper respiratory illness. We will be in touch to make sure you have no questions, and to schedule your 6-month scan.  Lung RADS 4 A: nodules with concerning findings, recommendation is most often for a follow up scan in 3 months or additional testing based on our provider's assessment of the scan. We will be in touch to make sure you have no questions and to schedule the recommended 3 month follow up scan.  Lung RADS 4 B:  indicates findings that are concerning. We will be in touch with you to schedule additional diagnostic testing based on our provider's  assessment of the scan.  Other options for assistance in smoking cessation (   As covered by your insurance benefits)  Hypnosis for smoking cessation  [employer] [phone]  Acupuncture for smoking cessation  [facility] [phone]   

## 2022-10-17 NOTE — Progress Notes (Signed)
Virtual Visit via Telephone Note  I connected with Heitor A Verbrugge on 10/17/22 at  8:30 AM EDT by telephone and verified that I am speaking with the correct person using two identifiers.  Location: Patient: Home Provider: Office    I discussed the limitations, risks, security and privacy concerns of performing an evaluation and management service by telephone and the availability of in person appointments. I also discussed with the patient that there may be a patient responsible charge related to this service. The patient expressed understanding and agreed to proceed.   Shared Decision Making Visit Lung Cancer Screening Program [phone])   Eligibility: Age 57 y.o. Pack Years Smoking History Calculation 90 (# packs/per year x # years smoked) Recent History of coughing up blood  no Unexplained weight loss? no ( >Than 15 pounds within the last 6 months ) Prior History Lung / other cancer no (Diagnosis within the last 5 years already requiring surveillance chest CT Scans). Smoking Status Current Smoker Former Smokers: Years since quit: < 1 year  Quit Date: NA  Visit Components: Discussion included one or more decision making aids. yes Discussion included risk/benefits of screening. yes Discussion included potential follow up diagnostic testing for abnormal scans. yes Discussion included meaning and risk of over diagnosis. yes Discussion included meaning and risk of False Positives. yes Discussion included meaning of total radiation exposure. yes  Counseling Included: Importance of adherence to annual lung cancer LDCT screening. yes Impact of comorbidities on ability to participate in the program. yes Ability and willingness to under diagnostic treatment. yes  Smoking Cessation Counseling: Current Smokers:  Discussed importance of smoking cessation. yes Information about tobacco cessation classes and interventions provided to patient. yes Patient provided with "ticket" for  LDCT Scan. NA Symptomatic Patient. no  Counseling(Intermediate counseling: > three minutes) 99406 Diagnosis Code: Tobacco Use Z72.0 Asymptomatic Patient yes  Counseling (Intermediate counseling: > three minutes counseling) U0454 Former Smokers:  Discussed the importance of maintaining cigarette abstinence. yes Diagnosis Code: Personal History of Nicotine Dependence. U98.119 Information about tobacco cessation classes and interventions provided to patient. Yes Patient provided with "ticket" for LDCT Scan. NA Written Order for Lung Cancer Screening with LDCT placed in Epic. Yes (CT Chest Lung Cancer Screening Low Dose W/O CM) [id number] Z12.2-Screening of respiratory organs Z87.891-Personal history of nicotine dependence  I have spent 25 minutes of face to face/ virtual visit   time with Mr Mierzejewski discussing the risks and benefits of lung cancer screening. We viewed / discussed a power point together that explained in detail the above noted topics. We paused at intervals to allow for questions to be asked and answered to ensure understanding.We discussed that the single most powerful action that he can take to decrease his risk of developing lung cancer is to quit smoking. We discussed whether or not he is ready to commit to setting a quit date. We discussed options for tools to aid in quitting smoking including nicotine replacement therapy, non-nicotine medications, support groups, Quit Smart classes, and behavior modification. We discussed that often times setting smaller, more achievable goals, such as eliminating 1 cigarette a day for a week and then 2 cigarettes a day for a week can be helpful in slowly decreasing the number of cigarettes smoked. This allows for a sense of accomplishment as well as providing a clinical benefit. I provided  him  with smoking cessation  information  with contact information for community resources, classes, free nicotine replacement therapy, and access to mobile  apps,  text messaging, and on-line smoking cessation help. I have also provided him  the office contact information in the event he needs to contact me, or the screening staff. We discussed the time and location of the scan, and that either Abigail Miyamoto RN, Karlton Lemon, RN  or I will call / send a letter with the results within 24-72 hours of receiving them. The patient verbalized understanding of all of  the above and had no further questions upon leaving the office. They have my contact information in the event they have any further questions.  I spent 3-5 minutes counseling on smoking cessation and the health risks of continued tobacco abuse.  I explained to the patient that there has been a high incidence of coronary artery disease noted on these exams. I explained that this is a non-gated exam therefore degree or severity cannot be determined. This patient is not on statin therapy. I have asked the patient to follow-up with their PCP regarding any incidental finding of coronary artery disease and management with diet or medication as their PCP  feels is clinically indicated. The patient verbalized understanding of the above and had no further questions upon completion of the visit.    Glenford Bayley, NP

## 2022-10-18 ENCOUNTER — Ambulatory Visit (HOSPITAL_BASED_OUTPATIENT_CLINIC_OR_DEPARTMENT_OTHER)
Admission: RE | Admit: 2022-10-18 | Discharge: 2022-10-18 | Disposition: A | Discharge status: Home / Self Care | Payer: BC Managed Care – PPO | Source: Ambulatory Visit | Attending: Acute Care | Admitting: Acute Care

## 2022-10-18 DIAGNOSIS — Z87891 Personal history of nicotine dependence: Secondary | ICD-10-CM | POA: Insufficient documentation

## 2022-10-18 DIAGNOSIS — F17210 Nicotine dependence, cigarettes, uncomplicated: Secondary | ICD-10-CM | POA: Diagnosis not present

## 2022-10-18 DIAGNOSIS — Z122 Encounter for screening for malignant neoplasm of respiratory organs: Secondary | ICD-10-CM | POA: Insufficient documentation

## 2022-10-24 ENCOUNTER — Other Ambulatory Visit: Payer: Self-pay

## 2022-10-24 DIAGNOSIS — Z87891 Personal history of nicotine dependence: Secondary | ICD-10-CM

## 2022-10-24 DIAGNOSIS — Z122 Encounter for screening for malignant neoplasm of respiratory organs: Secondary | ICD-10-CM

## 2022-10-24 DIAGNOSIS — F17200 Nicotine dependence, unspecified, uncomplicated: Secondary | ICD-10-CM

## 2022-12-12 DIAGNOSIS — L989 Disorder of the skin and subcutaneous tissue, unspecified: Secondary | ICD-10-CM | POA: Diagnosis not present

## 2023-01-03 DIAGNOSIS — R590 Localized enlarged lymph nodes: Secondary | ICD-10-CM | POA: Diagnosis not present

## 2023-01-10 DIAGNOSIS — M47812 Spondylosis without myelopathy or radiculopathy, cervical region: Secondary | ICD-10-CM | POA: Diagnosis not present

## 2023-01-10 DIAGNOSIS — R590 Localized enlarged lymph nodes: Secondary | ICD-10-CM | POA: Diagnosis not present

## 2023-01-12 DIAGNOSIS — D492 Neoplasm of unspecified behavior of bone, soft tissue, and skin: Secondary | ICD-10-CM | POA: Diagnosis not present

## 2023-01-12 DIAGNOSIS — K1370 Unspecified lesions of oral mucosa: Secondary | ICD-10-CM | POA: Diagnosis not present

## 2023-01-12 DIAGNOSIS — Z720 Tobacco use: Secondary | ICD-10-CM | POA: Diagnosis not present

## 2023-01-25 DIAGNOSIS — K1329 Other disturbances of oral epithelium, including tongue: Secondary | ICD-10-CM | POA: Diagnosis not present

## 2023-01-26 DIAGNOSIS — K1329 Other disturbances of oral epithelium, including tongue: Secondary | ICD-10-CM | POA: Diagnosis not present

## 2023-02-07 DIAGNOSIS — D492 Neoplasm of unspecified behavior of bone, soft tissue, and skin: Secondary | ICD-10-CM | POA: Diagnosis not present

## 2023-02-07 DIAGNOSIS — Z720 Tobacco use: Secondary | ICD-10-CM | POA: Diagnosis not present

## 2023-02-14 DIAGNOSIS — K1329 Other disturbances of oral epithelium, including tongue: Secondary | ICD-10-CM | POA: Diagnosis not present

## 2023-02-22 DIAGNOSIS — K1370 Unspecified lesions of oral mucosa: Secondary | ICD-10-CM | POA: Diagnosis not present

## 2023-03-28 ENCOUNTER — Ambulatory Visit: Payer: BC Managed Care – PPO | Admitting: Nurse Practitioner

## 2023-03-28 ENCOUNTER — Encounter: Payer: Self-pay | Admitting: Nurse Practitioner

## 2023-03-28 VITALS — BP 136/82 | HR 74 | Ht 66.0 in | Wt 214.6 lb

## 2023-03-28 DIAGNOSIS — J432 Centrilobular emphysema: Secondary | ICD-10-CM | POA: Diagnosis not present

## 2023-03-28 DIAGNOSIS — G2581 Restless legs syndrome: Secondary | ICD-10-CM

## 2023-03-28 DIAGNOSIS — R0609 Other forms of dyspnea: Secondary | ICD-10-CM

## 2023-03-28 DIAGNOSIS — Z720 Tobacco use: Secondary | ICD-10-CM

## 2023-03-28 DIAGNOSIS — G4733 Obstructive sleep apnea (adult) (pediatric): Secondary | ICD-10-CM

## 2023-03-28 MED ORDER — STIOLTO RESPIMAT 2.5-2.5 MCG/ACT IN AERS
2.0000 | INHALATION_SPRAY | Freq: Every day | RESPIRATORY_TRACT | 5 refills | Status: AC
Start: 2023-03-28 — End: ?

## 2023-03-28 NOTE — Assessment & Plan Note (Addendum)
Mild obstruction on previous pulmonary function testing.  He has had worsening dyspnea over the last year.  CT chest from June with emphysematous changes and a calcified granuloma, otherwise negative. Symptoms not significantly changed since this time. Will repeat his PFTs. Restart Stiolto. Teachback performed. Side effect profile reviewed. Tolerated well previously. Action plan in place. Encouraged to work on graded exercises. Walk test at follow up.

## 2023-03-28 NOTE — Progress Notes (Signed)
@Patient  ID: Stephen Sims, male    DOB: [birth year], 57 y.o.   MRN: [id number]  Chief Complaint  Patient presents with   Follow-up    OSA F/U visit    Referring provider: Barbie Banner, MD  HPI: 57 year old male, active smoker followed for OSA on CPAP and emphysema. He is a former patient of Dr. Evlyn Courier and last seen in office on 09/26/2022. Past medical history significant for depression.  TEST/EVENTS:  09/29/2020 PFTs: FVC 74, FEV1 72, ratio 74, TLC 70, DLCOcor 103. Mild restrictive airway disease with normal diffusion capacity. No significant BD.  02/24/2021 LDCT chest (only read available and reviewed): benign calcified nodule in RUL; multiple scattered micronodules.There is mild centrilobular emphysema. Mild lower lobe BTX. No LAD. There is mild CAD.  10/18/2022 LDCT chest: mild centrilobular and paraseptal emphysematous changes, upper lung predominant. 8.1 cm calcified granuloma in the RUL, benign. Lung-RADS 1, negative   09/29/2020: OV with Parrett,NP for follow up. Wearing CPAP nightly with good benefit. Active smoker - 1 ppd. Has tried chantix and wellbutrin in past without success and had significant side effects from Chantix. Reports DOE over the last several years. Completed PFTs which showed a mild restriction and normal DLCO. Recommended trial of albuterol. Referred to LDCT program.   09/07/2021: OV with Sherena Machorro NP for follow up with his wife. He was treated around 3 weeks ago for pneumonia and then suspected bronchitis by his PCP. He completed abx around a week and a half ago. Reports that he has been feeling better since. Cough has returned to his baseline, which is productive, primarily in the mornings, and his DOE is about how it normally is. Gets winded with incline walking, especially stairs.  Plan for repeat CXR in 4 weeks to ensure resolution. He did undergo a LDCT through his PCP in October. Results were reviewed today which showed mild centrilobular emphysema and some mild  bronchiectasis. Trial Stiolto. He did have a mild restriction on his previous PFTs; no formal obstruction. Question weight vs possible ILD. 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 He also does carpentry work as a hobby and has cats at home. If no improvement with Stiolto, recommended further ILD workup. He continues to smoke 1/4 pack a day but has now taken up vaping as he thought this was better than smoking. He has tried Chantix in the past with negative side effects, nicotine patches without much success and Wellbutrin without much success. Referred to lung cancer screening program and smoking cessation program.   Regarding his CPAP, he continues to wear it every night without fail and receive good benefit from it. Wife reported snoring around the machine. His median pressures were around 12 cmH2O with max 15.7 so adjusted his settings to 10-18 cmH2O.  10/05/2021: OV with Evelean Bigler NP for follow up after being started on Stiolto. He reports feeling well. He has noticed a difference in his breathing with Stiolto and feels like his activity tolerance is better. He does continue to have a chronic cough which doesn't bother him much and is unchanged from his baseline. It is rarely productive with white sputum. He denies any wheezing, leg swelling, fevers, night sweats, hemoptysis, weight loss. He has not had to use his rescue inhaler recently. He is still smoking 1/4-1/2 pack per day and vaping occasionally. Clinically improved since being treated for pna. CXR showed a hazy right perihilar opacity, unable to  compare to previous imaging. Likely resolving infection. Recommended rechecking in one month and if persistent, obtain CT chest.   They both report that he is still having issues with snoring around his CPAP. They don't think that his pressure settings ever were changed. He wears it nightly without fail. Upon review of his download,  his pressures were never adjusted. AHI 2.8. Sent order for them to be increased to 10-18 cmH2O again.   11/07/2021: OV with Sadira Standard for follow up. His chest x ray today is clear with complete resolution of previous opacity. He reports that he has been feeling well, from a respiratory standpoint. Feels like the Stiolto has allowed him to complete activities and walk further without getting as winded. Cough is stable and unchanged from baseline. He does continue to smoke 1/4-1/2 ppd but he is wanting to quit. He has tried multiple things in the past without success, but also feels like the drive to quit wasn't present.   Regarding his CPAP, he feels like he is sleeping better with it. He will still occasionally have some condensation in his tube but feels like it is not as bad as it was previously. His wife did tell him that his snoring is better but she will still hear him snore around his CPAP occasionally. He denies morning headaches or drowsy driving.   02/20/2022: OV with Dr. Craige Cotta.  PFT from June showed mild COPD.  Still smokes cigarettes.  Wants to use nicotine patch.  Has low stress at work.  Has an occasional cough.  Does not feel like inhalers help much.  Has been using them a couple times per week.  Gets winded with strenuous activity.  Okay to stop Stiolto.  Use albuterol as needed.  Discussed other smoking cessation options.  Plan to try nicotine patch.  Uses CPAP nightly.  Wife noting that he still snores and will kick around at night.  Has been feeling more tired during the day.  Has had trouble with water building up in his mask.  Arrange for CPAP titration study.  09/26/2022: OV with Dr. Craige Cotta.  Still smoking cigarettes.  Feels too much stress at work.  He works Hospital doctor for apartment complexes.  Wife says he is coughing more.  Feels like he should use albuterol more but ran out.  Wife says that he is still snoring and kicks his legs while sleeping.  He is not aware that he does this.  She also  notices that sometimes he punches or talks as if he is fighting in his sleep and acting on his dream.  Has not sustained any injuries from this.  Happened a few times per month.  CPAP titration study was never completed.  Reordered at OV.  Concern for periodic limb movement and or REM sleep disorder.   03/28/2023: Today-follow-up Patient presents today for follow-up with his wife.  Never had CPAP titration study.  No one ever called him to schedule this.  He is wearing his CPAP nightly.  He does feel like it helps him rest better but he still tired during the day.  His wife says that he is still snoring.  He does wear a nasal mask.  Not currently wearing a chinstrap.  Has not tried 1 in the past.  Does not feel like his pressures below too much.  Leaks do not tend to bother him.  His wife says that he still has episodes where he will punch or kick in his sleep.  Also seems to  have restless legs at night.  He does not ever notice this.  No one has sustained any injuries.  No sleepwalking.  No episodes of sleep paralysis.  No sleep aids. Regarding his breathing, he does feel like its gotten worse over the last year.  He feels like he cannot walk up a flight of stairs without getting winded.  His job is very taxing and physically strenuous.  He has a daily cough with clear to white phlegm, which is unchanged from his baseline.  He does think that looking back the Stiolto helped him more than he thought it did.  He is using albuterol 6-8 times a month but does feel like he may need it more often.  Denies any wheezing, lower extremity swelling, orthopnea, palpitations, lightheadedness/dizziness, fevers, chills, hemoptysis, weight loss.  Last lung cancer screening CT was in June and negative.  02/25/2023-03/26/2023: CPAP 12-20 cmH2O 30/30 days; 100% greater than 4 hours Pressure 95th 18.8 Leaks 95th 36.1 AHI 1.3  Allergies  Allergen Reactions   Penicillins Other (See Comments)    Per patient ate through  his brain,    Codeine Nausea Only   Erythromycin Rash     There is no immunization history on file for this patient.  Past Medical History:  Diagnosis Date   Depression    Sleep apnea     Tobacco History: Social History   Tobacco Use  Smoking Status Every Day   Current packs/day: 1.50   Average packs/day: 1.5 packs/day for 35.0 years (52.5 ttl pk-yrs)   Types: Cigarettes  Smokeless Tobacco Never  Tobacco Comments   1 pack per day.BT CMA    Ready to quit: Not Answered Counseling given: Not Answered Tobacco comments: 1 pack per day.BT CMA    Outpatient Medications Prior to Visit  Medication Sig Dispense Refill   albuterol (VENTOLIN HFA) 108 (90 Base) MCG/ACT inhaler Inhale 2 puffs into the lungs every 6 (six) hours as needed for wheezing or shortness of breath. 8 g 2   amLODipine (NORVASC) 5 MG tablet TAKE 1 TABLET BY MOUTH EVERYDAY AT BEDTIME     cyanocobalamin (VITAMIN B12) 500 MCG tablet Take 500 mcg by mouth daily.     potassium chloride (KLOR-CON) 10 MEQ tablet Take 10 mEq by mouth daily.     venlafaxine (EFFEXOR) 100 MG tablet Take 1 tablet by mouth daily.     No facility-administered medications prior to visit.     Review of Systems:   Constitutional: No weight loss or gain, night sweats, fevers, chills, or lassitude. +fatigue  HEENT: No headaches, difficulty swallowing, tooth/dental problems, or sore throat. No sneezing, itching, ear ache, nasal congestion, or post nasal drip CV:  No chest pain, orthopnea, PND, swelling in lower extremities, anasarca, dizziness, palpitations, syncope Resp: +snoring; shortness of breath with exertion (mainly incline walking/climbing); chronic cough (baseline). No excess mucus or change in color of mucus. No hemoptysis. No wheezing.  No chest wall deformity Skin: No rash, lesions, ulcerations MSK:  No joint pain or swelling.  No decreased range of motion.  No back pain. Neuro: No dizziness or lightheadedness.  Psych: No  depression or anxiety. Mood stable. +sleep disturbance     Physical Exam:  BP 136/82 (BP Location: Right Arm, Cuff Size: Large)   Pulse 74   Ht 5\' 6"  (1.676 m)   Wt 214 lb 9.6 oz (97.3 kg)   SpO2 96%   BMI 34.64 kg/m   GEN: Pleasant, interactive, well-appearing; obese; in no acute distress.  HEENT:  Normocephalic and atraumatic. PERRLA. Sclera white. Nasal turbinates pink, moist and patent bilaterally. No rhinorrhea present. Oropharynx pink and moist, without exudate or edema. No lesions, ulcerations, or postnasal drip.  NECK:  Supple w/ fair ROM. No JVD present. Normal carotid impulses w/o bruits. Thyroid symmetrical with no goiter or nodules palpated. No lymphadenopathy.   CV: RRR, no m/r/g, no peripheral edema. Pulses intact, +2 bilaterally. No cyanosis, pallor or clubbing. PULMONARY:  Unlabored, regular breathing. Clear bilaterally A&P w/o wheezes/rales/rhonchi. No accessory muscle use. No dullness to percussion. GI: BS present and normoactive. Soft, non-tender to palpation. No organomegaly or masses detected.  MSK: No erythema, warmth or tenderness. Cap refil <2 sec all extrem. No deformities or joint swelling noted.  Neuro: A/Ox3. No focal deficits noted.   Skin: Warm, no lesions or rashe Psych: Normal affect and behavior. Judgement and thought content appropriate.     Lab Results:  CBC No results found for: "WBC", "RBC", "HGB", "HCT", "PLT", "MCV", "MCH", "MCHC", "RDW", "LYMPHSABS", "MONOABS", "EOSABS", "BASOSABS"  BMET No results found for: "NA", "K", "CL", "CO2", "GLUCOSE", "BUN", "CREATININE", "CALCIUM", "GFRNONAA", "GFRAA"  BNP No results found for: "BNP"   Imaging:  No results found.  Administration History     None          Latest Ref Rng & Units 09/29/2020    8:53 AM  PFT Results  FVC-Pre L 3.03   FVC-Predicted Pre % 72   FVC-Post L 3.10   FVC-Predicted Post % 74   Pre FEV1/FVC % % 75   Post FEV1/FCV % % 74   FEV1-Pre L 2.27   FEV1-Predicted Pre  % 71   FEV1-Post L 2.30   DLCO uncorrected ml/min/mmHg 25.35   DLCO UNC% % 103   DLCO corrected ml/min/mmHg 25.35   DLCO COR %Predicted % 103   DLVA Predicted % 122   TLC L 4.28   TLC % Predicted % 70   RV % Predicted % 59     No results found for: "NITRICOXIDE"      Assessment & Plan:   OSA (obstructive sleep apnea) OSA on CPAP.  Excellent control and compliance on download.  He continues to have residual fatigue and snoring at night.  Will add on a chinstrap to his nasal mask.  He also has symptoms concerning for REM sleep disorder and/or restless leg.  Never had CPAP titration study for further evaluation.  This could be contributing to his persistent daytime sleepiness.  Will follow-up with patient care coronaries on this.  Plan to schedule in lab study for further evaluation.  Aware of proper care/use of device.  Encouraged to continue wearing nightly.  Safe driving practices reviewed.  Patient Instructions  Restart Stiolto 2 puffs daily Continue Albuterol inhaler 2 puffs every 6 hours as needed for shortness of breath or wheezing. Notify if symptoms persist despite rescue inhaler/neb use.   Continue to use CPAP every night, minimum of 4-6 hours a night.  Change equipment as directed. Wash your tubing with warm soap and water daily, hang to dry. Wash humidifier portion weekly. Use bottled, distilled water and change daily Be aware of reduced alertness and do not drive or operate heavy machinery if experiencing this or drowsiness.  Exercise encouraged, as tolerated. Healthy weight management discussed.  Avoid or decrease alcohol consumption and medications that make you more sleepy, if possible. Notify if persistent daytime sleepiness occurs even with consistent use of PAP therapy.  Add on chin strap to your CPAP  Attend CPAP titration study once scheduled - someone should call you in the next 1-2 weeks to schedule  Let me know if you want to go to the smoking cessation  group  Labs today  Lung cancer screening CT June 2025   Follow up in 6-8 weeks after PFT with Dr. Wynona Neat (new pt slot) or Katie Mylee Falin,NP. If symptoms do not improve or worsen, please contact office for sooner follow up or seek emergency care.   Centrilobular emphysema (HCC) Mild obstruction on previous pulmonary function testing.  He has had worsening dyspnea over the last year.  CT chest from June with emphysematous changes and a calcified granuloma, otherwise negative. Symptoms not significantly changed since this time. Will repeat his PFTs. Restart Stiolto. Teachback performed. Side effect profile reviewed. Tolerated well previously. Action plan in place. Encouraged to work on graded exercises. Walk test at follow up.  Restless leg syndrome Symptoms concerning for RLS. Check iron studies today. OSA appears well controlled. In lab study recommended for further evaluation. See above  DOE (dyspnea on exertion) See above. If no improvement with above plan, will obtain further workup to rule out cardiac disease as contributing factor.   Tobacco user Smoking cessation strongly advised. No desire to quit at this time. Understands risks of continued smoking. He will let us know if he changes his mind.     I spent 35 minutes of dedicated to the care of this patient on the date of this encounter to include pre-visit review of records, face-to-face time with the patient discussing conditions above, post visit ordering of testing, clinical documentation with the electronic health record, making appropriate referrals as documented, and communicating necessary findings to members of the patients care team.  Noemi Chapel, NP 03/28/2023  Pt aware and understands NP's role.

## 2023-03-28 NOTE — Assessment & Plan Note (Signed)
Symptoms concerning for RLS. Check iron studies today. OSA appears well controlled. In lab study recommended for further evaluation. See above

## 2023-03-28 NOTE — Assessment & Plan Note (Signed)
Smoking cessation strongly advised. No desire to quit at this time. Understands risks of continued smoking. He will let us know if he changes his mind.

## 2023-03-28 NOTE — Patient Instructions (Addendum)
Restart Stiolto 2 puffs daily Continue Albuterol inhaler 2 puffs every 6 hours as needed for shortness of breath or wheezing. Notify if symptoms persist despite rescue inhaler/neb use.   Continue to use CPAP every night, minimum of 4-6 hours a night.  Change equipment as directed. Wash your tubing with warm soap and water daily, hang to dry. Wash humidifier portion weekly. Use bottled, distilled water and change daily Be aware of reduced alertness and do not drive or operate heavy machinery if experiencing this or drowsiness.  Exercise encouraged, as tolerated. Healthy weight management discussed.  Avoid or decrease alcohol consumption and medications that make you more sleepy, if possible. Notify if persistent daytime sleepiness occurs even with consistent use of PAP therapy.  Add on chin strap to your CPAP Attend CPAP titration study once scheduled - someone should call you in the next 1-2 weeks to schedule  Let me know if you want to go to the smoking cessation group  Labs today  Lung cancer screening CT June 2025   Follow up in 6-8 weeks after PFT with Dr. Wynona Neat (new pt slot) or Katie Canon Gola,NP. If symptoms do not improve or worsen, please contact office for sooner follow up or seek emergency care.

## 2023-03-28 NOTE — Assessment & Plan Note (Signed)
See above. If no improvement with above plan, will obtain further workup to rule out cardiac disease as contributing factor.

## 2023-03-28 NOTE — Assessment & Plan Note (Signed)
OSA on CPAP.  Excellent control and compliance on download.  He continues to have residual fatigue and snoring at night.  Will add on a chinstrap to his nasal mask.  He also has symptoms concerning for REM sleep disorder and/or restless leg.  Never had CPAP titration study for further evaluation.  This could be contributing to his persistent daytime sleepiness.  Will follow-up with patient care coronaries on this.  Plan to schedule in lab study for further evaluation.  Aware of proper care/use of device.  Encouraged to continue wearing nightly.  Safe driving practices reviewed.  Patient Instructions  Restart Stiolto 2 puffs daily Continue Albuterol inhaler 2 puffs every 6 hours as needed for shortness of breath or wheezing. Notify if symptoms persist despite rescue inhaler/neb use.   Continue to use CPAP every night, minimum of 4-6 hours a night.  Change equipment as directed. Wash your tubing with warm soap and water daily, hang to dry. Wash humidifier portion weekly. Use bottled, distilled water and change daily Be aware of reduced alertness and do not drive or operate heavy machinery if experiencing this or drowsiness.  Exercise encouraged, as tolerated. Healthy weight management discussed.  Avoid or decrease alcohol consumption and medications that make you more sleepy, if possible. Notify if persistent daytime sleepiness occurs even with consistent use of PAP therapy.  Add on chin strap to your CPAP Attend CPAP titration study once scheduled - someone should call you in the next 1-2 weeks to schedule  Let me know if you want to go to the smoking cessation group  Labs today  Lung cancer screening CT June 2025   Follow up in 6-8 weeks after PFT with Dr. Wynona Neat (new pt slot) or Katie Allecia Bells,NP. If symptoms do not improve or worsen, please contact office for sooner follow up or seek emergency care.

## 2023-04-29 IMAGING — DX DG CHEST 2V
2 series · 2 of 2 positions shown · non-contrast
Comparison: Most recently 08/27/2017

CLINICAL DATA: Follow-up pneumonia

EXAM:
CHEST - 2 VIEW

[chest pa]
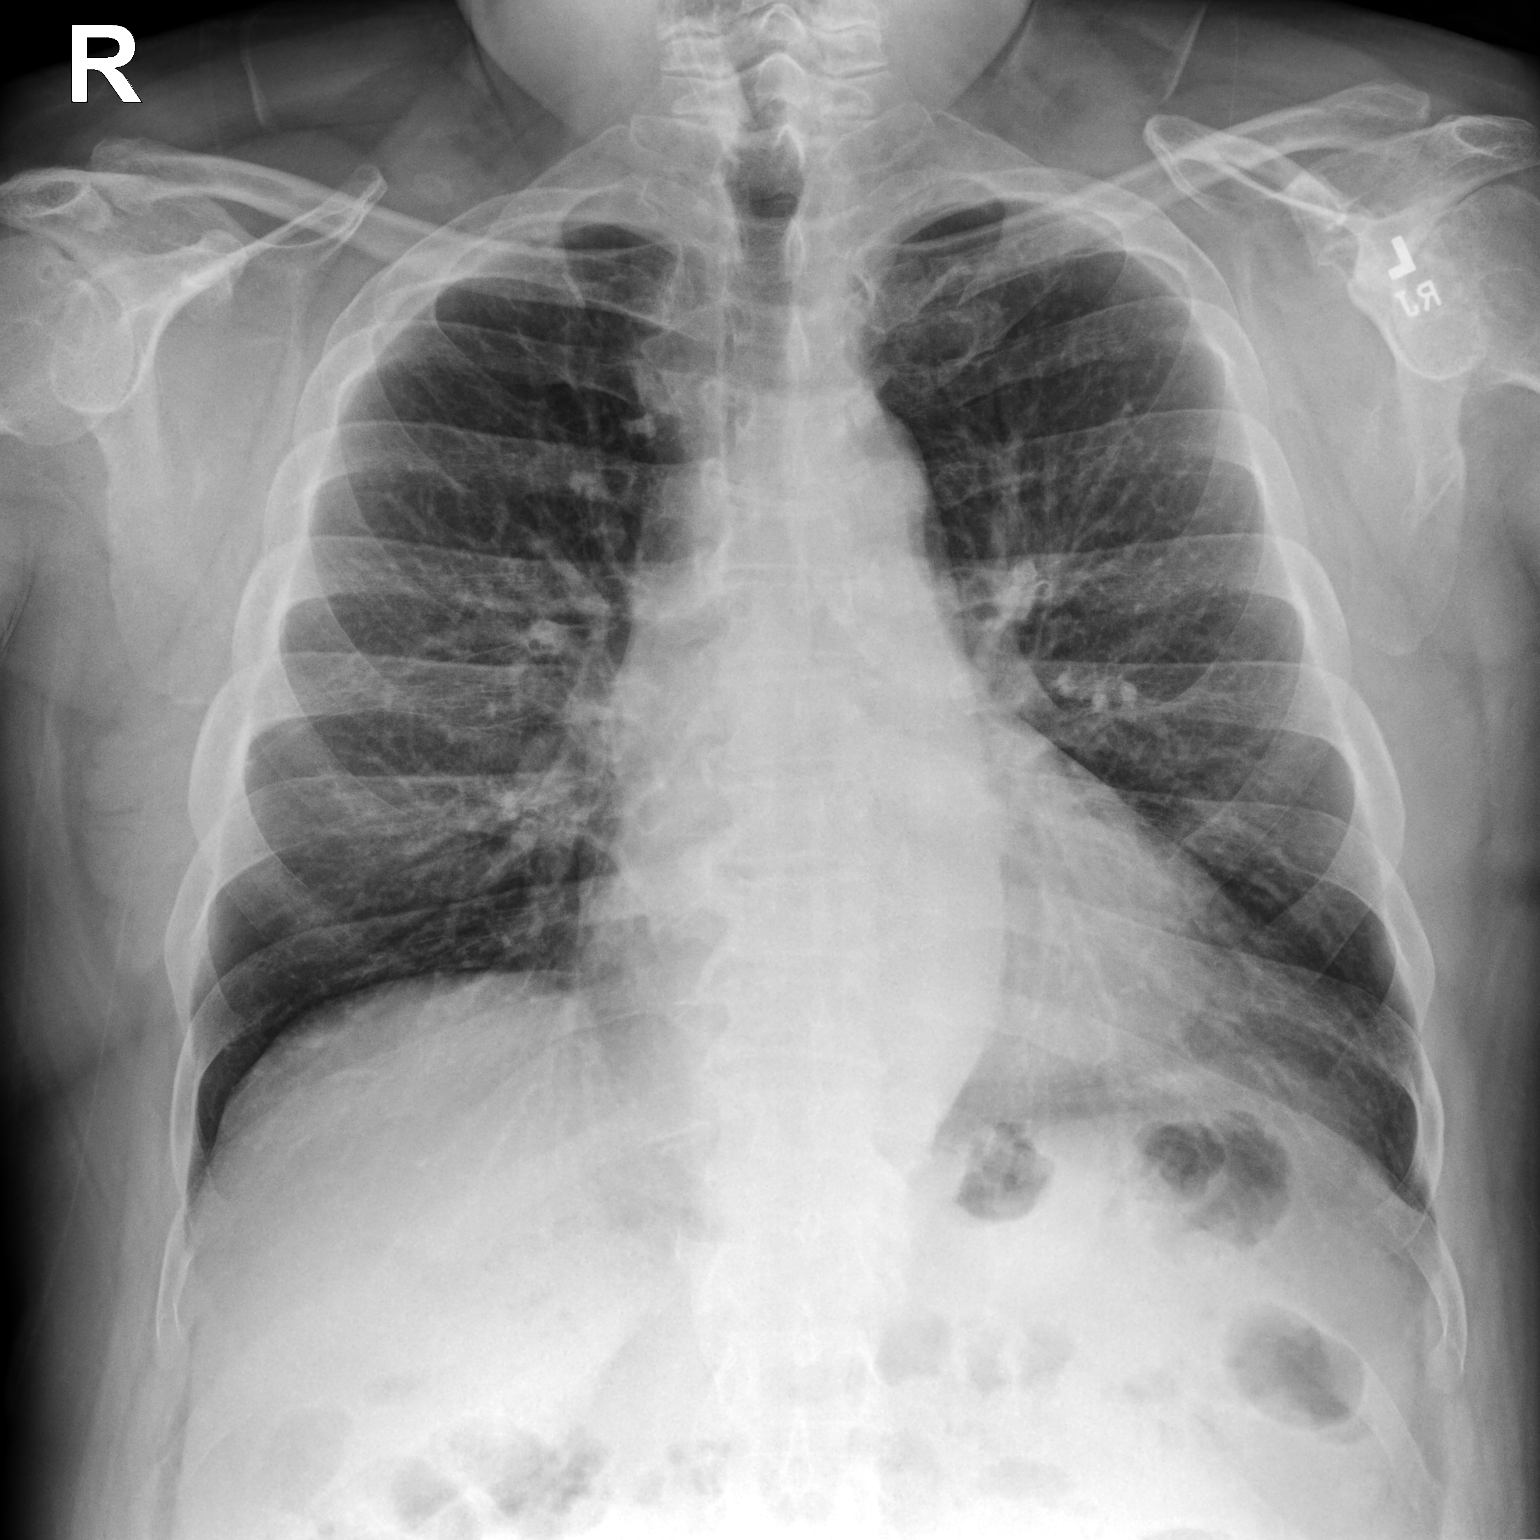

[chest lat]
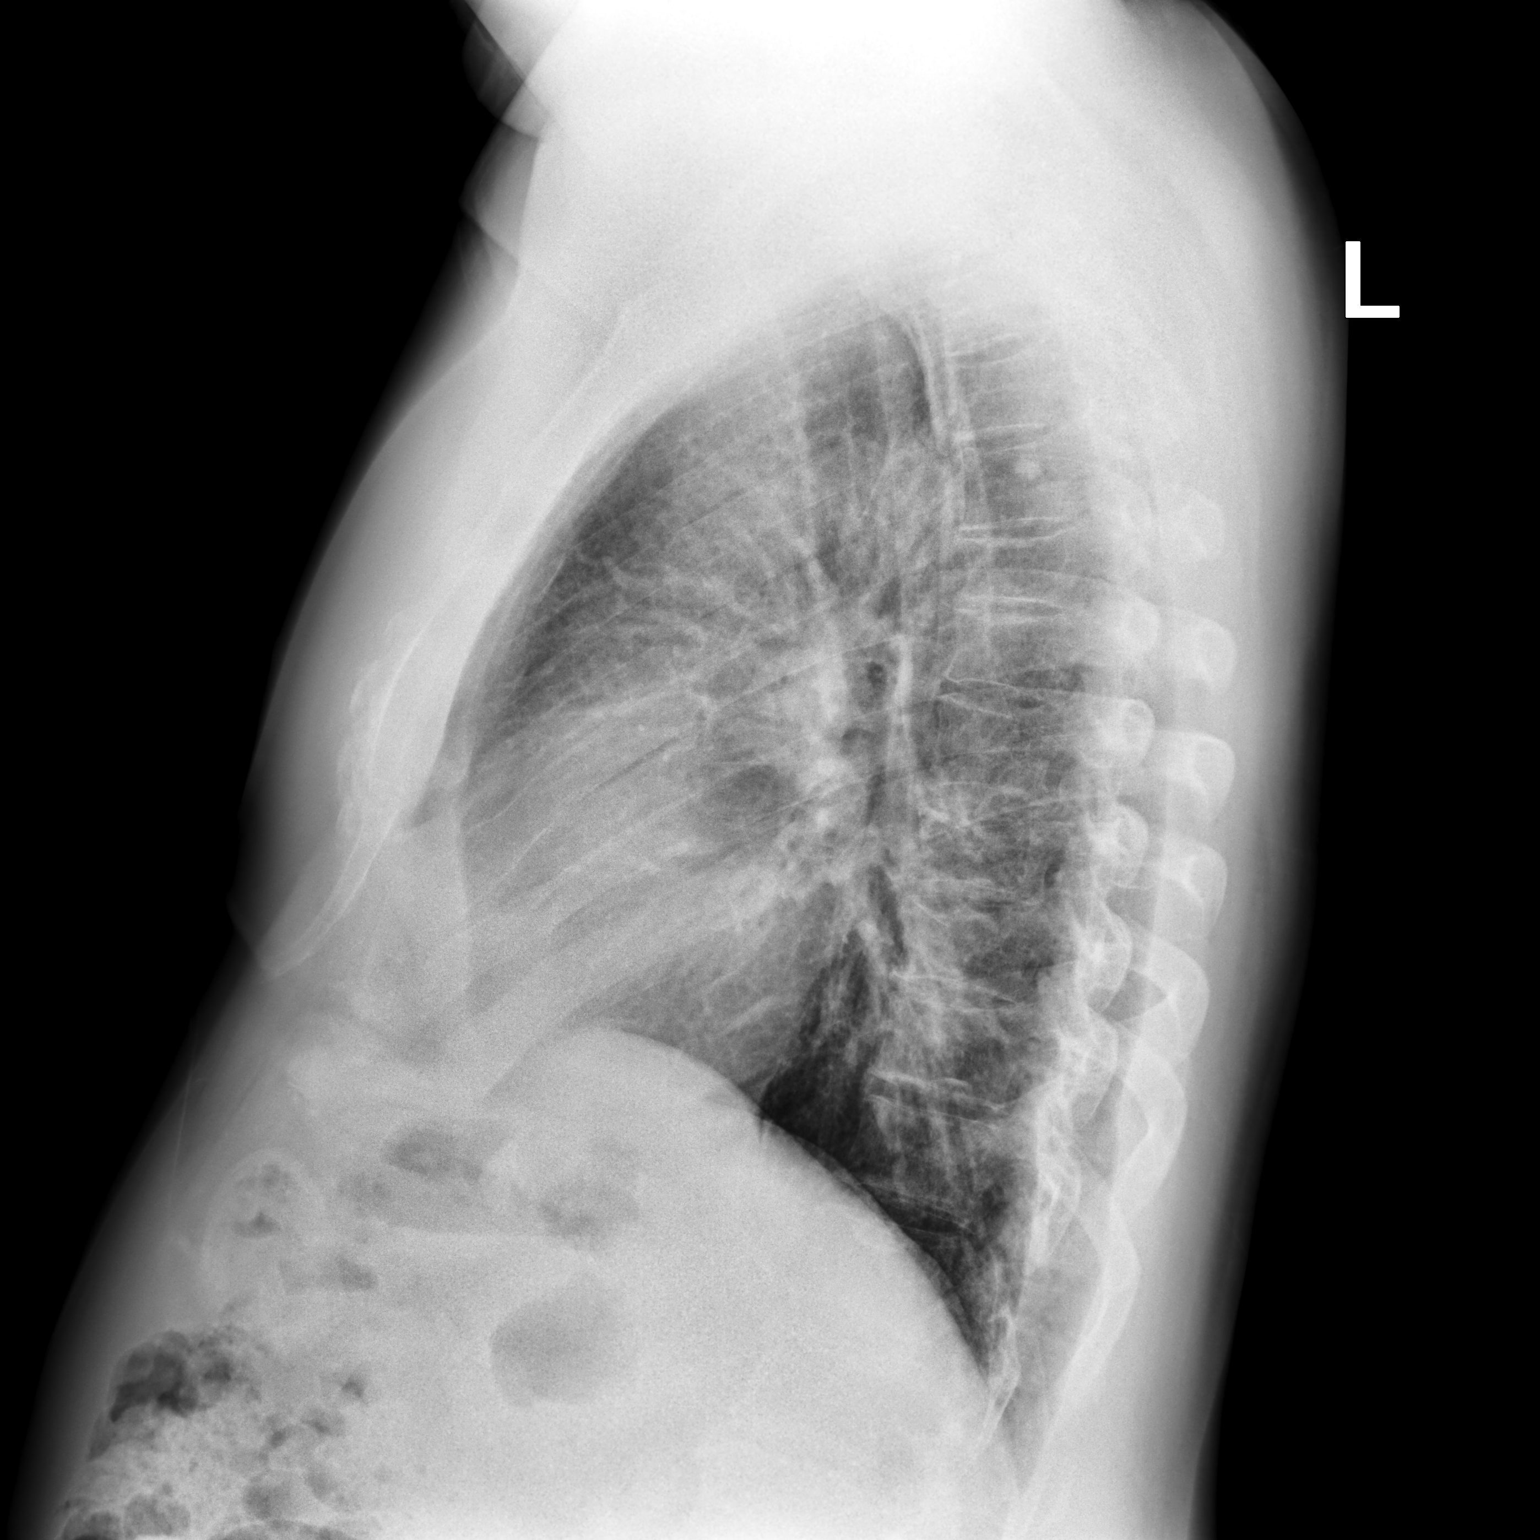

[2 of 2 positions shown; findings below may reference images not displayed]

FINDINGS: The heart size and mediastinal contours are within normal limits.
Hazy right perihilar opacity. Mildly prominent interstitial markings
bilaterally. No pleural effusion or pneumothorax. The visualized
skeletal structures are unremarkable.
IMPRESSION: Hazy right perihilar opacity which could represent a resolving
infectious or inflammatory process given the history. No recent
prior imaging is available to assess for interval change.

## 2023-05-02 DIAGNOSIS — J441 Chronic obstructive pulmonary disease with (acute) exacerbation: Secondary | ICD-10-CM | POA: Diagnosis not present

## 2023-05-02 DIAGNOSIS — R051 Acute cough: Secondary | ICD-10-CM | POA: Diagnosis not present

## 2023-05-02 DIAGNOSIS — R0602 Shortness of breath: Secondary | ICD-10-CM | POA: Diagnosis not present

## 2023-05-02 DIAGNOSIS — J189 Pneumonia, unspecified organism: Secondary | ICD-10-CM | POA: Diagnosis not present

## 2023-05-09 ENCOUNTER — Encounter (HOSPITAL_BASED_OUTPATIENT_CLINIC_OR_DEPARTMENT_OTHER): Payer: BC Managed Care – PPO | Admitting: Pulmonary Disease

## 2023-05-17 DIAGNOSIS — F17200 Nicotine dependence, unspecified, uncomplicated: Secondary | ICD-10-CM | POA: Diagnosis not present

## 2023-05-17 DIAGNOSIS — F1090 Alcohol use, unspecified, uncomplicated: Secondary | ICD-10-CM | POA: Diagnosis not present

## 2023-05-17 DIAGNOSIS — K1321 Leukoplakia of oral mucosa, including tongue: Secondary | ICD-10-CM | POA: Diagnosis not present

## 2023-05-17 DIAGNOSIS — K1370 Unspecified lesions of oral mucosa: Secondary | ICD-10-CM | POA: Diagnosis not present

## 2023-05-23 DIAGNOSIS — R051 Acute cough: Secondary | ICD-10-CM | POA: Diagnosis not present

## 2023-05-23 DIAGNOSIS — R5383 Other fatigue: Secondary | ICD-10-CM | POA: Diagnosis not present

## 2023-05-23 DIAGNOSIS — R0600 Dyspnea, unspecified: Secondary | ICD-10-CM | POA: Diagnosis not present

## 2023-05-25 DIAGNOSIS — G4733 Obstructive sleep apnea (adult) (pediatric): Secondary | ICD-10-CM | POA: Diagnosis not present

## 2023-05-31 NOTE — Progress Notes (Signed)
Cardiology Office Note:  .   Date:  06/01/2023  ID:  Stephen Sims, DOB 01-27-1966, MRN [id number] PCP: Stephen Marlin, DO  [facility] Providers Cardiologist:  Stephen Miss, MD    History of Present Illness: Stephen Sims Kitchen   Stephen Sims is a 58 y.o. male with hx of cigarette smoking  He is a friend of a mutual friend , Stephen Sims ( RN on 6E)  Seen with wife Stephen Sims   Has COPD, OSA ( wears his CPAP ) , HTN   Had "pneumonia"Jan. 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  Eats lots of processed foods Lots of fried food  Eats lots of processed meats  Sausage biscuit every morning   Lipids from February 28, 2022 in the [facility] Reveal an LDL of 112 Total cholesterol is 172 Triglyceride level 77 HDL is 56  Fam hx  Father had several stents - 1st stent in his 23 , had atrial fib  Grandfather had atrial fib   Brother - atrial fib     ROS:   Studies Reviewed: Stephen Sims Kitchen   EKG Interpretation Date/Time:  Friday June 01 2023 11:40:50 EST Ventricular Rate:  89 PR Interval:  152 QRS Duration:  82 QT Interval:  352 QTC Calculation: 428 R Axis:   270  Text Interpretation: Normal sinus rhythm Right superior axis deviation Pulmonary disease pattern No previous ECGs available Confirmed by Stephen Sims [phone]) on 06/01/2023 12:07:40 PM    EKG Interpretation Date/Time:  Friday June 01 2023 11:40:50 EST Ventricular Rate:  89 PR  Interval:  152 QRS Duration:  82 QT Interval:  352 QTC Calculation: 428 R Axis:   270  Text Interpretation: Normal sinus rhythm Right superior axis deviation Pulmonary disease pattern No previous ECGs available Confirmed by Stephen Sims ([id number]) on 06/01/2023 12:07:40 PM   Risk Assessment/Calculations:             Physical Exam:   VS:  BP 136/86   Pulse 88   Ht 5\' 6"  (1.676 m)   Wt 209 lb (94.8 kg)   SpO2 97%   BMI 33.73 kg/m    Wt Readings from Last 3 Encounters:  06/01/23 209 lb (94.8 kg)  03/28/23 214 lb 9.6 oz (97.3 kg)  09/26/22 214 lb 3.2 oz (97.2 kg)    GEN: Well nourished, well developed in no acute distress NECK: No JVD; No carotid bruits CARDIAC: RRR, no murmurs, rubs, gallops RESPIRATORY:  Clear to auscultation without rales, wheezing or rhonchi  ABDOMEN: Soft, non-tender, non-distended EXTREMITIES:  No edema; No deformity   ASSESSMENT AND PLAN: .   1.  Unstable angina: Stephen presents with symptoms that are consistent with unstable angina.  He has chest tightness with radiation to his intrascapular region.  This occurs with every day activities such as walking across the parking  lot or climbing 1 flight of stairs.  He has a long history of heavy cigarette smoking.  His lipids are not all that high.  He eats a completely unrestricted diet including lots of fried foods and some salty processed meats.  He has a strong family history of coronary artery disease.  He is having angina with everyday activities.  I think that we should refer him for heart catheterization.  We have discussed about the risk, benefits, options of heart catheterization.  He understands and agrees to proceed.  Will start aspirin 81 mg a day, give him nitroglycerin to take on an as-needed basis, start atorvastatin 40 mg a day.   I have given him ER precautions.  If he develops chest tightness that does not resolve with nitroglycerin he needs to call 911 and get to the hospital.   Will get an  echocardiogram for further assessment of his LV function.    Informed Consent   Shared Decision Making/Informed Consent The risks [stroke (1 in 1000), death (1 in 1000), kidney failure [usually temporary] (1 in 500), bleeding (1 in 200), allergic reaction [possibly serious] (1 in 200)], benefits (diagnostic support and management of coronary artery disease) and alternatives of a cardiac catheterization were discussed in detail with Mr. Sims and he is willing to proceed.     Dispo: APP or me in several months   Signed, Stephen Miss, MD

## 2023-06-01 ENCOUNTER — Ambulatory Visit: Payer: BC Managed Care – PPO | Attending: Cardiovascular Disease | Admitting: Cardiovascular Disease

## 2023-06-01 ENCOUNTER — Encounter: Payer: Self-pay | Admitting: Cardiovascular Disease

## 2023-06-01 VITALS — BP 136/86 | HR 88 | Ht 66.0 in | Wt 209.0 lb

## 2023-06-01 DIAGNOSIS — I200 Unstable angina: Secondary | ICD-10-CM

## 2023-06-01 DIAGNOSIS — I10 Essential (primary) hypertension: Secondary | ICD-10-CM

## 2023-06-01 DIAGNOSIS — E785 Hyperlipidemia, unspecified: Secondary | ICD-10-CM

## 2023-06-01 DIAGNOSIS — R079 Chest pain, unspecified: Secondary | ICD-10-CM | POA: Diagnosis not present

## 2023-06-01 DIAGNOSIS — Z01812 Encounter for preprocedural laboratory examination: Secondary | ICD-10-CM | POA: Diagnosis not present

## 2023-06-01 MED ORDER — ASPIRIN 81 MG PO TBEC: 81.0000 mg | DELAYED_RELEASE_TABLET | Freq: Every day | ORAL | 3 refills | Status: AC

## 2023-06-01 MED ORDER — NITROGLYCERIN 0.4 MG SL SUBL: 0.4000 mg | SUBLINGUAL_TABLET | SUBLINGUAL | 12 refills | Status: AC | PRN

## 2023-06-01 MED ORDER — ATORVASTATIN CALCIUM 40 MG PO TABS: 40.0000 mg | ORAL_TABLET | Freq: Every day | ORAL | 3 refills | Status: AC

## 2023-06-01 NOTE — Patient Instructions (Signed)
Medication Instructions:  Nitroglycerin as needed for chest pain Dissolve 1 tablet under the tongue every 5 minutes as needed for chest pain. Max of 3 doses, then 911.  Aspirin 81 mg once daily Atorvastatin 40 mg once daily *If you need a refill on your cardiac medications before your next appointment, please call your pharmacy*   Lab Work: Today: CBC, BMET If you have labs (blood work) drawn today and your tests are completely normal, you will receive your results only by: MyChart Message (if you have MyChart) OR A paper copy in the mail If you have any lab test that is abnormal or we need to change your treatment, we will call you to review the results.   Testing/Procedures: ECHO 06/05/23 at 8:15 AM (if you show up after 8:30 AM it will need to be rescheduled) Your physician has requested that you have an echocardiogram. Echocardiography is a painless test that uses sound waves to create images of your heart. It provides your doctor with information about the size and shape of your heart and how well your heart's chambers and valves are working. This procedure takes approximately one hour. There are no restrictions for this procedure. Please do NOT wear cologne, perfume, aftershave, or lotions (deodorant is allowed). Please arrive 15 minutes prior to your appointment time.  Please note: We ask at that you not bring children with you during ultrasound (echo/ vascular) testing. Due to room size and safety concerns, children are not allowed in the ultrasound rooms during exams. Our front office staff cannot provide observation of children in our lobby area while testing is being conducted. An adult accompanying a patient to their appointment will only be allowed in the ultrasound room at the discretion of the ultrasound technician under special circumstances. We apologize for any inconvenience.  Left Heart Cath Your physician has requested that you have a cardiac catheterization. Cardiac  catheterization is used to diagnose and/or treat various heart conditions. Doctors may recommend this procedure for a number of different reasons. The most common reason is to evaluate chest pain. Chest pain can be a symptom of coronary artery disease (CAD), and cardiac catheterization can show whether plaque is narrowing or blocking your heart's arteries. This procedure is also used to evaluate the valves, as well as measure the blood flow and oxygen levels in different parts of your heart. For further information please visit [email or web address] Please follow instruction sheet, as given.  Follow-Up: At [facility], you and your health needs are our priority.  As part of our continuing mission to provide you with exceptional heart care, we have created designated Provider Care Teams.  These Care Teams include your primary Cardiologist (physician) and Advanced Practice Providers (APPs -  Physician Assistants and Nurse Practitioners) who all work together to provide you with the care you need, when you need it.  We recommend signing up for the patient portal called "MyChart".  Sign up information is provided on this After Visit Summary.  MyChart is used to connect with patients for Virtual Visits (Telemedicine).  Patients are able to view lab/test results, encounter notes, upcoming appointments, etc.  Non-urgent messages can be sent to your provider as well.   To learn more about what you can do with MyChart, go to [email or web address].    Your next appointment:   Will arrange after cath  Other Instructions  [facility] [place]  [facility] [address] [place], [place]  [address] [place] [address] Dept: [phone] Loc: [phone]  Zackariah Vanderpol [place]  06/01/2023  You are scheduled for a Cardiac Catheterization on Thursday, February 6 with Dr. Peter [place].  1. Please arrive at the [facility] (Main Entrance A) at  [facility]: [address] [place], [place] [address] at 5:30 AM (This time is 2 hour(s) before your procedure to ensure your preparation).   Free valet parking service is available. You will check in at ADMITTING. The support person will be asked to wait in the waiting room.  It is OK to have someone drop you off and come back when you are ready to be discharged.    Special note: Every effort is made to have your procedure done on time. Please understand that emergencies sometimes delay scheduled procedures.  2. Diet: Do not eat solid foods after midnight.  The patient may have clear liquids until 5am upon the day of the procedure.  3. Labs: You will need to have blood drawn TODAY  4. Medication instructions in preparation for your procedure:   Contrast Allergy: No  On the morning of your procedure, take your Aspirin 81 mg and any morning medicines NOT listed above.  You may use sips of water.  5. Plan to go home the same day, you will only stay overnight if medically necessary. 6. Bring a current list of your medications and current insurance cards. 7. You MUST have a responsible person to drive you home. 8. Someone MUST be with you the first 24 hours after you arrive home or your discharge will be delayed. 9. Please wear clothes that are easy to get on and off and wear slip-on shoes.  Thank you for allowing [place] to care for you!   -- [facility]     1st Floor: - Lobby - Registration  - Pharmacy  - Lab - Cafe  2nd Floor: - PV Lab - Diagnostic Testing (echo, CT, nuclear med)  3rd Floor: - Vacant  4th Floor: - TCTS (cardiothoracic surgery) - AFib Clinic - Structural Heart Clinic - Vascular Surgery  - Vascular Ultrasound  5th Floor: - HeartCare Cardiology (general and EP) - Clinical Pharmacy for coumadin, hypertension, lipid, weight-loss medications, and med management appointments    Valet parking services will be available  as well.

## 2023-06-02 LAB — BASIC METABOLIC PANEL
BUN/Creatinine Ratio: 20 (ref 9–20)
BUN: 20 mg/dL (ref 6–24)
CO2: 24 mmol/L (ref 20–29)
Calcium: 9.4 mg/dL (ref 8.7–10.2)
Chloride: 108 mmol/L — ABNORMAL HIGH (ref 96–106)
Creatinine, Ser: 1 mg/dL (ref 0.76–1.27)
Glucose: 83 mg/dL (ref 70–99)
Potassium: 4.9 mmol/L (ref 3.5–5.2)
Sodium: 146 mmol/L — ABNORMAL HIGH (ref 134–144)
eGFR: 87 mL/min/{1.73_m2} (ref >59)

## 2023-06-02 LAB — CBC
Hematocrit: 46.6 % (ref 37.5–51.0)
Hemoglobin: 15.5 g/dL (ref 13.0–17.7)
MCH: 32.3 pg (ref 26.6–33.0)
MCHC: 33.3 g/dL (ref 31.5–35.7)
MCV: 97 fL (ref 79–97)
Platelets: 317 10*3/uL (ref 150–450)
RBC: 4.8 x10E6/uL (ref 4.14–5.80)
RDW: 12.1 % (ref 11.6–15.4)
WBC: 7.8 10*3/uL (ref 3.4–10.8)

## 2023-06-04 ENCOUNTER — Encounter: Payer: Self-pay | Admitting: Cardiovascular Disease

## 2023-06-05 ENCOUNTER — Ambulatory Visit (HOSPITAL_COMMUNITY): Payer: BC Managed Care – PPO | Attending: Cardiovascular Disease

## 2023-06-05 ENCOUNTER — Encounter: Payer: Self-pay | Admitting: Cardiovascular Disease

## 2023-06-05 ENCOUNTER — Telehealth: Payer: Self-pay | Admitting: *Deleted

## 2023-06-05 DIAGNOSIS — I10 Essential (primary) hypertension: Secondary | ICD-10-CM | POA: Diagnosis not present

## 2023-06-05 DIAGNOSIS — I200 Unstable angina: Secondary | ICD-10-CM | POA: Diagnosis not present

## 2023-06-05 LAB — ECHOCARDIOGRAM COMPLETE
Area-P 1/2: 2.37 cm2
S' Lateral: 2.9 cm

## 2023-06-05 NOTE — Telephone Encounter (Signed)
 Cardiac Catheterization scheduled at [facility] for: Thursday June 07, 2023 7:30 AM Arrival time [facility] Main Entrance A at: 5:30 AM  Nothing to eat after midnight prior to procedure, clear liquids until 5 AM day of procedure.  Medication instructions: -Usual morning medications can be taken with sips of water including aspirin  81 mg.  Plan to go home the same day, you will only stay overnight if medically necessary.  You must have responsible adult to drive you home.  Someone must be with you the first 24 hours after you arrive home.  Reviewed procedure instructions with patient.

## 2023-06-07 ENCOUNTER — Encounter (HOSPITAL_COMMUNITY)
Admission: RE | Disposition: A | Discharge status: Home / Self Care | Payer: Self-pay | Source: Home / Self Care | Attending: Cardiology

## 2023-06-07 ENCOUNTER — Ambulatory Visit (HOSPITAL_COMMUNITY)
Admission: RE | Admit: 2023-06-07 | Discharge: 2023-06-07 | Disposition: A | Discharge status: Home / Self Care | Payer: BC Managed Care – PPO | Attending: Cardiology | Admitting: Cardiology

## 2023-06-07 ENCOUNTER — Other Ambulatory Visit: Payer: Self-pay

## 2023-06-07 DIAGNOSIS — G4733 Obstructive sleep apnea (adult) (pediatric): Secondary | ICD-10-CM | POA: Insufficient documentation

## 2023-06-07 DIAGNOSIS — J449 Chronic obstructive pulmonary disease, unspecified: Secondary | ICD-10-CM | POA: Diagnosis not present

## 2023-06-07 DIAGNOSIS — F17210 Nicotine dependence, cigarettes, uncomplicated: Secondary | ICD-10-CM | POA: Diagnosis not present

## 2023-06-07 DIAGNOSIS — I10 Essential (primary) hypertension: Secondary | ICD-10-CM | POA: Insufficient documentation

## 2023-06-07 DIAGNOSIS — Z79899 Other long term (current) drug therapy: Secondary | ICD-10-CM | POA: Insufficient documentation

## 2023-06-07 DIAGNOSIS — Z7982 Long term (current) use of aspirin: Secondary | ICD-10-CM | POA: Insufficient documentation

## 2023-06-07 DIAGNOSIS — R0609 Other forms of dyspnea: Secondary | ICD-10-CM | POA: Insufficient documentation

## 2023-06-07 DIAGNOSIS — E785 Hyperlipidemia, unspecified: Secondary | ICD-10-CM

## 2023-06-07 DIAGNOSIS — Z8249 Family history of ischemic heart disease and other diseases of the circulatory system: Secondary | ICD-10-CM | POA: Insufficient documentation

## 2023-06-07 DIAGNOSIS — I200 Unstable angina: Secondary | ICD-10-CM | POA: Diagnosis not present

## 2023-06-07 HISTORY — PX: LEFT HEART CATH AND CORONARY ANGIOGRAPHY: CATH118249

## 2023-06-07 HISTORY — PX: LEFT HEART CATH AND CORS/GRAFTS ANGIOGRAPHY: CATH118250

## 2023-06-07 SURGERY — LEFT HEART CATH AND CORS/GRAFTS ANGIOGRAPHY
Anesthesia: LOCAL

## 2023-06-07 MED ORDER — SODIUM CHLORIDE 0.9% FLUSH: 3.0000 mL | INTRAVENOUS | Status: DC | PRN

## 2023-06-07 MED ORDER — HEPARIN SODIUM (PORCINE) 1000 UNIT/ML IJ SOLN
INTRAMUSCULAR | Status: AC
  Filled 2023-06-07: qty 10

## 2023-06-07 MED ORDER — SODIUM CHLORIDE 0.9% FLUSH: 3.0000 mL | Freq: Two times a day (BID) | INTRAVENOUS | Status: DC

## 2023-06-07 MED ORDER — ASPIRIN 81 MG PO CHEW: 81.0000 mg | CHEWABLE_TABLET | ORAL | Status: DC

## 2023-06-07 MED ORDER — MIDAZOLAM HCL 2 MG/2ML IJ SOLN
INTRAMUSCULAR | Status: AC
Start: 2023-06-07 — End: ?
  Filled 2023-06-07: qty 2

## 2023-06-07 MED ORDER — ACETAMINOPHEN 325 MG PO TABS: 650.0000 mg | ORAL_TABLET | ORAL | Status: DC | PRN

## 2023-06-07 MED ORDER — HEPARIN (PORCINE) IN NACL 1000-0.9 UT/500ML-% IV SOLN
INTRAVENOUS | Status: DC | PRN
  Administered 2023-06-07 (×2): 500 mL

## 2023-06-07 MED ORDER — VERAPAMIL HCL 2.5 MG/ML IV SOLN
INTRAVENOUS | Status: AC
  Filled 2023-06-07: qty 2

## 2023-06-07 MED ORDER — ONDANSETRON HCL 4 MG/2ML IJ SOLN: 4.0000 mg | Freq: Four times a day (QID) | INTRAMUSCULAR | Status: DC | PRN

## 2023-06-07 MED ORDER — HEPARIN SODIUM (PORCINE) 1000 UNIT/ML IJ SOLN
INTRAMUSCULAR | Status: DC | PRN
  Administered 2023-06-07: 5000 [IU] via INTRAVENOUS

## 2023-06-07 MED ORDER — SODIUM CHLORIDE 0.9 % IV SOLN: 250.0000 mL | INTRAVENOUS | Status: DC | PRN

## 2023-06-07 MED ORDER — IOHEXOL 350 MG/ML SOLN
INTRAVENOUS | Status: DC | PRN
  Administered 2023-06-07: 30 mL

## 2023-06-07 MED ORDER — SODIUM CHLORIDE 0.9 % WEIGHT BASED INFUSION: 3.0000 mL/kg/h | INTRAVENOUS | Status: AC

## 2023-06-07 MED ORDER — SODIUM CHLORIDE 0.9 % WEIGHT BASED INFUSION: 1.0000 mL/kg/h | INTRAVENOUS | Status: DC

## 2023-06-07 MED ORDER — VERAPAMIL HCL 2.5 MG/ML IV SOLN: INTRAVENOUS | Status: DC | PRN

## 2023-06-07 MED ORDER — FENTANYL CITRATE (PF) 100 MCG/2ML IJ SOLN
INTRAMUSCULAR | Status: AC
  Filled 2023-06-07: qty 2

## 2023-06-07 MED ORDER — FENTANYL CITRATE (PF) 100 MCG/2ML IJ SOLN
INTRAMUSCULAR | Status: DC | PRN
  Administered 2023-06-07: 25 ug via INTRAVENOUS

## 2023-06-07 MED ORDER — LIDOCAINE HCL (PF) 1 % IJ SOLN
INTRAMUSCULAR | Status: AC
  Filled 2023-06-07: qty 30

## 2023-06-07 MED ORDER — MIDAZOLAM HCL 2 MG/2ML IJ SOLN
INTRAMUSCULAR | Status: DC | PRN
  Administered 2023-06-07: 2 mg via INTRAVENOUS

## 2023-06-07 MED ORDER — LIDOCAINE HCL (PF) 1 % IJ SOLN
INTRAMUSCULAR | Status: DC | PRN
  Administered 2023-06-07: 2 mL

## 2023-06-07 SURGICAL SUPPLY — 10 items
CATH 5FR JL3.5 JR4 ANG PIG MP (CATHETERS) IMPLANT
DEVICE RAD COMP TR BAND LRG (VASCULAR PRODUCTS) IMPLANT
ELECT DEFIB PAD ADLT CADENCE (PAD) IMPLANT
GLIDESHEATH SLEND SS 6F .021 (SHEATH) IMPLANT
GUIDEWIRE INQWIRE 1.5J.035X260 (WIRE) IMPLANT
INQWIRE 1.5J .035X260CM (WIRE) ×1
KIT SYRINGE INJ CVI SPIKEX1 (MISCELLANEOUS) IMPLANT
PACK CARDIAC CATHETERIZATION (CUSTOM PROCEDURE TRAY) ×1 IMPLANT
SET ATX-X65L (MISCELLANEOUS) IMPLANT
SHEATH PROBE COVER 6X72 (BAG) IMPLANT

## 2023-06-07 NOTE — Progress Notes (Signed)
 Bridgette Campus, RN notified of client c/o chest tightness, no new orders

## 2023-06-07 NOTE — Interval H&P Note (Signed)
 History and Physical Interval Note:  06/07/2023 7:10 AM  Stephen Sims  has presented today for surgery, with the diagnosis of usa .  The various methods of treatment have been discussed with the patient and family. After consideration of risks, benefits and other options for treatment, the patient has consented to  Procedure(s): LEFT HEART CATH AND CORS/GRAFTS ANGIOGRAPHY (N/A) as a surgical intervention.  The patient's history has been reviewed, patient examined, no change in status, stable for surgery.  I have reviewed the patient's chart and labs.  Questions were answered to the patient's satisfaction.   Cath Lab Visit (complete for each Cath Lab visit)  Clinical Evaluation Leading to the Procedure:   ACS: Yes.    Non-ACS:    Anginal Classification: CCS III  Anti-ischemic medical therapy: Minimal Therapy (1 class of medications)  Non-Invasive Test Results: No non-invasive testing performed  Prior CABG: No previous CABG        Stephen Sims [facility] 06/07/2023 7:10 AM

## 2023-06-08 ENCOUNTER — Encounter (HOSPITAL_COMMUNITY): Payer: Self-pay | Admitting: Cardiology

## 2023-06-18 ENCOUNTER — Encounter: Payer: Self-pay | Admitting: Pulmonary Disease

## 2023-06-25 ENCOUNTER — Ambulatory Visit (HOSPITAL_BASED_OUTPATIENT_CLINIC_OR_DEPARTMENT_OTHER): Payer: BC Managed Care – PPO | Attending: Pulmonary Disease | Admitting: Pulmonary Disease

## 2023-06-25 DIAGNOSIS — G4761 Periodic limb movement disorder: Secondary | ICD-10-CM | POA: Insufficient documentation

## 2023-06-25 DIAGNOSIS — G4733 Obstructive sleep apnea (adult) (pediatric): Secondary | ICD-10-CM | POA: Diagnosis not present

## 2023-06-29 ENCOUNTER — Ambulatory Visit: Payer: BC Managed Care – PPO | Admitting: Pulmonary Disease

## 2023-06-30 ENCOUNTER — Telehealth: Payer: Self-pay | Admitting: Pulmonary Disease

## 2023-06-30 DIAGNOSIS — G4733 Obstructive sleep apnea (adult) (pediatric): Secondary | ICD-10-CM | POA: Diagnosis not present

## 2023-06-30 NOTE — Procedures (Signed)
 [facility] [address] [place], [place] [address] Tel: [phone]   Fax: [phone]  Titration Interpretation  Patient Name:  Stephen Sims, Stephen Sims Date:  06/25/2023 Referring Physician:  Coralyn Helling, Md  Indications for Polysomnography The patient is a 58 year old Male who is 5\' 6"  and weighs 205.0 lbs. His BMI equals 33.3.  A full night titration treatment study was performed.  Polysomnogram Data A full night polysomnogram recorded the standard physiologic parameters including EEG, EOG, EMG, EKG, nasal and oral airflow.  Respiratory parameters of chest and abdominal movements were recorded with Respiratory Inductance Plethysmography belts.  Oxygen saturation was recorded by pulse oximetry.   Sleep Architecture The total recording time of the polysomnogram was 402.4 minutes.  The total sleep time was 310.5 minutes.  The patient spent 6.1% of total sleep time in Stage N1, 85.5% in Stage N2, 8.4% in Stages N3, and 0.0% in REM.  Sleep latency was 0.5 minutes.  REM latency was - minutes.  Sleep Efficiency was 77.2%.  Wake after Sleep Onset time was 91.5 minutes.  Titration Summary The patient was titrated at pressures ranging from 8* cm/H20 with supplemental oxygen at - up to 17* cm/H20 with supplemental oxygen at -.  The last pressure used in the study was 17* cm/H20 with supplemental oxygen at -.  Respiratory Events The polysomnogram revealed a presence of 2 obstructive, - central, and - mixed apneas resulting in an Apnea index of 0.4 events per hour.  There were 30 hypopneas (>=3% desat and/or Ar.) resulting in an Apnea\Hypopnea Index (AHI >=3% desat and/or Ar.) of 6.2 events per hour.  There were 10 hypopneas (>=4% desat) resulting in an Apnea\Hypopnea Index (AHI >=4% desat) of 2.3 events per hour.  There were 9 Respiratory Effort Related Arousals resulting in a RERA index of 1.7 events per hour. The Respiratory Disturbance Index is 7.9 events per  hour.   Mean oxygen saturation was 94.0%.  The lowest oxygen saturation during sleep was 89.0%.  Time spent <=88% oxygen saturation was - minutes (-).  Limb Activity There were 12 limb movements recorded.  Of this total, 7 were classified as PLMs.  Of the PLMs, 1 were associated with arousals.  The Limb Movement index was 2.3 per hour while the PLM index was 1.4 per hour.  Cardiac Summary The average pulse rate was 61.7 bpm.  The minimum pulse rate was 47.0 bpm while the maximum pulse rate was 76.0 bpm.  Cardiac rhythm was normal/abnormal.  Diagnosis:  . Obstructive sleep apnea  Fair sleep efficiency  Optimal control of snoring, apneic events  No significant desaturations  No evidence of REM behavior disorder noted during the study.  Recommendations:  Continue CPAP therapy  May continue current auto CPAP settings of 12-20 with heated humidification with mask of choice  Fixed pressure of 17 cm may also be considered if auto CPAP not well tolerated  Close clinical follow-up with compliance monitoring recommended  Avoid alcohol, sedatives and other CNS depressants that may worsen sleep apnea and disrupt normal sleep architecture  Weight management and regular exercise should be initiated or continued if appropriate.    Accredited Board Certified in Sleep Medicine Date/Time:

## 2023-06-30 NOTE — Telephone Encounter (Addendum)
 Call patient  Sleep study result  Date of study: 06/25/2023  Impression: Obstructive sleep apnea, optimal pressures achieved  No REM behavior disorder was recorded during the study, no parasomnias  Did not achieve REM sleep during the study  Recommendation:  Continue CPAP therapy  May continue on current auto CPAP 12-20  Fixed CPAP setting of 17 may be considered if auto CPAP not well-tolerated  Close clinical follow-up with compliance monitoring for optimal control of symptoms  Encourage weight loss measures  Encouraged regular exercises

## 2023-07-03 NOTE — Telephone Encounter (Signed)
ATC x1, LVM to return call.

## 2023-07-05 ENCOUNTER — Ambulatory Visit: Payer: BC Managed Care – PPO | Admitting: Nurse Practitioner

## 2023-07-05 NOTE — Progress Notes (Signed)
 Cardiology Office Note:  .   Date:  07/06/2023  ID:  Redge Gainer Geeting, DOB 07-Aug-1965, MRN [id number] PCP: Christen Butter, NP  [facility] Providers Cardiologist:  Kristeen Miss, MD {  History of Present Illness: Stephen Sims   Johnmatthew Solorio Bourn is a 58 y.o. male with a past medical history of cigarette smoking, COPD, OSA on CPAP, and HTN here for hospital follow-up.  History includes pneumonia January 1 with difficulty recovering.  Still was short of breath and coughing up yellow/green mucus.  Positive sweats.  DOE.  Chest tightness with any activity.  The chest pressure radiates through his back and does not radiate up to his jaw.  Able to climb 1 flight of stairs without stopping.  2 flights of stairs he does so so with him 3 flights of stairs it depends on the day.  No regular exercise.  Smokes 2 to 3 packs/day until a month ago and now smokes 1 pack a day.  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  Eats a lot of processed foods, fried foods, processed meats.  He enjoys a sausage biscuit every morning.  Lipids from February 28, 2022 revealed LDL of 112, total cholesterol 172, triglycerides 77, and HDL 56.  Family history includes father with several stents with for stent in his 42s, atrial fibrillation.  Grandfather had atrial fibrillation.  Brother with atrial fibrillation.  Given his unstable angina that was occurring every day with activity such as walking across a parking lot or climbing 1 flight of stairs including a strong family history eating an unrestricted diet and heavy cigarette smoking he was referred for cardiac catheterization.  Luckily, on coronary catheterization he did not have any coronary artery disease.  He was started on aspirin 81 mg daily and given nitroglycerin to take as needed.  Also started on atorvastatin 40 mg daily.  Today, he presents with a history of cardiac issues, recently underwent a cardiac catheterization which came back clean. He is  currently on aspirin, nitroglycerin, atorvastatin, and amlodipine. He reports that his blood pressure has been slightly high and he has been experiencing swelling in his legs, which may be a side effect of the amlodipine. He has a history of COPD and uses a CPAP machine for sleep apnea. He is currently trying to quit smoking. His wife reports that he snores heavily and wakes up frequently during the night.  Reports no shortness of breath nor dyspnea on exertion. Reports no chest pain, pressure, or tightness. No edema, orthopnea, PND. Reports no palpitations.   Discussed the use of AI scribe software for clinical note transcription with the patient, who gave verbal consent to proceed.  ROS: Pertinent ROS in HPI  Studies Reviewed: Stephen Sims       Coronary catheterization 06/07/2023  Left Main  Vessel was injected. Vessel is normal in caliber. Vessel is angiographically normal.    Left Anterior Descending  Vessel was injected. Vessel is normal in caliber. Vessel is angiographically normal.    Left Circumflex  Vessel was injected. Vessel is normal in caliber. Vessel is angiographically normal.    Right Coronary Artery  Vessel was injected. Vessel is normal in caliber. Vessel is angiographically normal.    Intervention   No interventions have been documented.   Left Heart  Left Ventricle LV end diastolic pressure is normal.   Coronary Diagrams  Diagnostic Dominance: Right  Intervention      Physical Exam:   VS:  BP (!) 160/90  Pulse 92   Ht 5\' 6"  (1.676 m)   Wt 215 lb 9.6 oz (97.8 kg)   SpO2 97%   BMI 34.80 kg/m    Wt Readings from Last 3 Encounters:  07/06/23 215 lb 9.6 oz (97.8 kg)  06/25/23 205 lb (93 kg)  06/07/23 209 lb (94.8 kg)    GEN: Well nourished, well developed in no acute distress NECK: No JVD; No carotid bruits CARDIAC: RRR, no murmurs, rubs, gallops RESPIRATORY:  Clear to auscultation without rales, wheezing or rhonchi  ABDOMEN: Soft, non-tender,  non-distended EXTREMITIES:  No edema; No deformity   ASSESSMENT AND PLAN: .   Coronary Artery Disease (CAD) Cardiac catheterization showed no significant CAD. Aspirin, nitroglycerin, and atorvastatin initiated for elevated LDL. - Continue aspirin and atorvastatin. - Use nitroglycerin as needed for angina. - Check lipid panel at upcoming physical appointment. - Request primary care to fax lipid panel results.  Hypertension Blood pressure slightly elevated. On amlodipine 5 mg, causing occasional peripheral edema. Recent travel may have affected blood pressure. - Monitor blood pressure at home for two weeks, especially one hour post-amlodipine. - Consider switching to losartan or valsartan if hypertension persists. - Maintain blood pressure under 135/85 mmHg.  Hyperlipidemia Previous LDL elevated at 112 mg/dL. Atorvastatin prescribed. - Check lipid panel at upcoming physical appointment. - Continue atorvastatin therapy.  Obstructive Sleep Apnea (OSA) Uses CPAP. Scheduled for pulmonology consultation. Discussed potential BiPAP transition and Inspire device. - Attend upcoming pulmonology appointment. - Discuss potential for BiPAP or Inspire device with pulmonologist.  Chronic Obstructive Pulmonary Disease (COPD) COPD well-controlled. Smoking cessation efforts ongoing with nicotine replacement therapy. - Continue smoking cessation efforts. - Consider referral to social worker Amy Nedra Hai for smoking cessation support.  Borderline Hyperkalemia Potassium level slightly elevated at 4.9 mmol/L. Advised to monitor potassium intake. - Reduce potassium supplement intake to every other day. - Check potassium levels at upcoming physical appointment.  Follow-up Follow-up with primary care and cardiology planned. Considering change of primary care provider. - Schedule follow-up with Dr. Elease Hashimoto before retirement. - Consider new primary care provider closer to home.    Dispo: He can follow-up in  a few months with MD  Signed, Sharlene Dory, PA-C

## 2023-07-06 ENCOUNTER — Encounter: Payer: Self-pay | Admitting: Physician Assistant

## 2023-07-06 ENCOUNTER — Ambulatory Visit: Payer: BC Managed Care – PPO | Attending: Physician Assistant | Admitting: Physician Assistant

## 2023-07-06 VITALS — BP 160/90 | HR 92 | Ht 66.0 in | Wt 215.6 lb

## 2023-07-06 DIAGNOSIS — I10 Essential (primary) hypertension: Secondary | ICD-10-CM | POA: Diagnosis not present

## 2023-07-06 DIAGNOSIS — E785 Hyperlipidemia, unspecified: Secondary | ICD-10-CM

## 2023-07-06 DIAGNOSIS — R079 Chest pain, unspecified: Secondary | ICD-10-CM

## 2023-07-06 DIAGNOSIS — J449 Chronic obstructive pulmonary disease, unspecified: Secondary | ICD-10-CM

## 2023-07-06 DIAGNOSIS — I200 Unstable angina: Secondary | ICD-10-CM

## 2023-07-06 DIAGNOSIS — G4733 Obstructive sleep apnea (adult) (pediatric): Secondary | ICD-10-CM

## 2023-07-06 DIAGNOSIS — Z720 Tobacco use: Secondary | ICD-10-CM

## 2023-07-06 MED ORDER — AMLODIPINE BESYLATE 5 MG PO TABS: 5.0000 mg | ORAL_TABLET | Freq: Every day | ORAL | 3 refills | Status: AC

## 2023-07-06 NOTE — Patient Instructions (Signed)
 Medication Instructions:   Your physician recommends that you continue on your current medications as directed. Please refer to the Current Medication list given to you today.   *If you need a refill on your cardiac medications before your next appointment, please call your pharmacy*   Lab Work: NONE ORDERED  TODAY    If you have labs (blood work) drawn today and your tests are completely normal, you will receive your results only by: MyChart Message (if you have MyChart) OR A paper copy in the mail If you have any lab test that is abnormal or we need to change your treatment, we will call you to review the results.    Testing/Procedures:  MAKE SURE YOU GET CMET AND LIPIDS LABS WITH PRIMARY PROVIDER  (PCP)   Follow-Up: At [facility], you and your health needs are our priority.  As part of our continuing mission to provide you with exceptional heart care, we have created designated Provider Care Teams.  These Care Teams include your primary Cardiologist (physician) and Advanced Practice Providers (APPs -  Physician Assistants and Nurse Practitioners) who all work together to provide you with the care you need, when you need it.  We recommend signing up for the patient portal called "MyChart".  Sign up information is provided on this After Visit Summary.  MyChart is used to connect with patients for Virtual Visits (Telemedicine).  Patients are able to view lab/test results, encounter notes, upcoming appointments, etc.  Non-urgent messages can be sent to your provider as well.   To learn more about what you can do with MyChart, go to [email or web address].    Your next appointment:    3 month(s)   Provider:    Kristeen Miss, MD    Other Instructions

## 2023-07-24 DIAGNOSIS — L989 Disorder of the skin and subcutaneous tissue, unspecified: Secondary | ICD-10-CM | POA: Diagnosis not present

## 2023-07-24 DIAGNOSIS — Z125 Encounter for screening for malignant neoplasm of prostate: Secondary | ICD-10-CM | POA: Diagnosis not present

## 2023-07-24 DIAGNOSIS — Z720 Tobacco use: Secondary | ICD-10-CM | POA: Diagnosis not present

## 2023-07-24 DIAGNOSIS — E785 Hyperlipidemia, unspecified: Secondary | ICD-10-CM | POA: Diagnosis not present

## 2023-07-24 DIAGNOSIS — F338 Other recurrent depressive disorders: Secondary | ICD-10-CM | POA: Diagnosis not present

## 2023-07-24 DIAGNOSIS — I10 Essential (primary) hypertension: Secondary | ICD-10-CM | POA: Diagnosis not present

## 2023-07-24 DIAGNOSIS — Z0000 Encounter for general adult medical examination without abnormal findings: Secondary | ICD-10-CM | POA: Diagnosis not present

## 2023-07-24 DIAGNOSIS — F418 Other specified anxiety disorders: Secondary | ICD-10-CM | POA: Diagnosis not present

## 2023-07-31 NOTE — Telephone Encounter (Signed)
 Called and spoke with patient, advised of results/recommendations per Dr. Wynona Neat.  He has a follow up with Dr. Ranae Palms on 08/08/23 at 1 pm.  I let him know that he would go into further detail with the sleep study at his visit. He stated that he is not getting good sleep and would discuss that at his follow up appointment.  Nothing further needed.

## 2023-08-08 ENCOUNTER — Encounter: Payer: BC Managed Care – PPO | Admitting: Pulmonary Disease

## 2023-08-08 ENCOUNTER — Encounter (HOSPITAL_BASED_OUTPATIENT_CLINIC_OR_DEPARTMENT_OTHER): Payer: BC Managed Care – PPO

## 2023-09-25 ENCOUNTER — Encounter: Admitting: Pulmonary Disease

## 2023-09-25 ENCOUNTER — Encounter (HOSPITAL_BASED_OUTPATIENT_CLINIC_OR_DEPARTMENT_OTHER)

## 2023-10-03 ENCOUNTER — Encounter: Payer: Self-pay | Admitting: Cardiovascular Disease

## 2023-10-03 NOTE — Progress Notes (Unsigned)
 Cardiology Office Note:  .   Date:  10/04/2023  ID:  Stephen Sims, DOB 1966-04-21, MRN [id number] PCP: Stephen Cornish, NP  [facility] Providers Cardiologist:  Stephen Alert, MD    History of Present Illness: Stephen Sims   Stephen Sims is a 58 y.o. male with hx of cigarette smoking  He is a friend of a mutual friend , Stephen Sims ( RN on 6E)  Seen with wife Stephen Sims   Has COPD, OSA ( wears his CPAP ) , HTN   Had "pneumonia"Jan. 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  Eats lots of processed foods Lots of fried food  Eats lots of processed meats  Sausage biscuit every morning   Lipids from February 28, 2022 in the [facility] Reveal an LDL of 112 Total cholesterol is 172 Triglyceride level 77 HDL is 56  Fam hx  Father had several stents - 1st stent in his 75 , had atrial fib  Grandfather had atrial fib   Brother - atrial fib   October 04, 2023  Stephen Sims (referred by friend -Stephen Fine, RN on 6E ) is seen for his unstable angina , cigarette smoking , Cath on Feb, 6, 2025 showed normal coronaries His symptoms are likely related to pulmonary issues      ROS:   Studies Reviewed: .            Risk Assessment/Calculations:    Physical Exam:    Physical Exam: Blood pressure (!) 144/80, pulse 77, height 5\' 10"  (1.778 m), weight 210 lb (95.3 kg), SpO2 98%.    GEN:  Well nourished, well developed in no acute  distress HEENT: Normal NECK: No JVD; No carotid bruits LYMPHATICS: No lymphadenopathy CARDIAC: RRR , no murmurs, rubs, gallops RESPIRATORY:  Clear to auscultation without rales, wheezing or rhonchi  ABDOMEN: Soft, non-tender, non-distended MUSCULOSKELETAL:  No edema; No deformity  SKIN: Warm and dry NEUROLOGIC:  Sims and oriented x 3      ASSESSMENT AND PLAN: .   1.  Chest pain:  cath showed no significant CAD .  His symptoms are likely coming from his lungs  I've encouraged him to stay off the cigarettes. Continue to follow up with his primary MD and with pulmonary   He will see us  on an as needed basis           Informed Consent   Shared Decision Making/Informed Consent The risks [stroke (1 in 1000), death (1 in 1000), kidney failure [usually temporary] (1 in 500), bleeding (1 in 200), allergic reaction [possibly serious] (1 in 200)], benefits (diagnostic support and management of coronary artery disease) and alternatives of a cardiac catheterization were discussed in detail with Stephen Sims and he is willing to proceed.     Dispo: APP or me in several months   Signed, Stephen Sims  Stephen Tolley, MD

## 2023-10-04 ENCOUNTER — Ambulatory Visit: Attending: Cardiovascular Disease | Admitting: Cardiovascular Disease

## 2023-10-04 ENCOUNTER — Encounter: Payer: Self-pay | Admitting: Cardiovascular Disease

## 2023-10-04 VITALS — BP 144/80 | HR 77 | Ht 70.0 in | Wt 210.0 lb

## 2023-10-04 DIAGNOSIS — R0609 Other forms of dyspnea: Secondary | ICD-10-CM

## 2023-10-04 DIAGNOSIS — R079 Chest pain, unspecified: Secondary | ICD-10-CM | POA: Diagnosis not present

## 2023-10-04 NOTE — Patient Instructions (Signed)
 Medication Instructions:  Your physician recommends that you continue on your current medications as directed. Please refer to the Current Medication list given to you today.  *If you need a refill on your cardiac medications before your next appointment, please call your pharmacy*  Lab Work: None ordered.  If you have labs (blood work) drawn today and your tests are completely normal, you will receive your results only by: MyChart Message (if you have MyChart) OR A paper copy in the mail If you have any lab test that is abnormal or we need to change your treatment, we will call you to review the results.  Testing/Procedures: None ordered.   Follow-Up: At [facility], you and your health needs are our priority.  As part of our continuing mission to provide you with exceptional heart care, our providers are all part of one team.  This team includes your primary Cardiologist (physician) and Advanced Practice Providers or APPs (Physician Assistants and Nurse Practitioners) who all work together to provide you with the care you need, when you need it.  Your next appointment:   Follow up with Dr Alroy Aspen as needed

## 2023-10-18 ENCOUNTER — Other Ambulatory Visit: Payer: Self-pay | Admitting: Acute Care

## 2023-10-18 DIAGNOSIS — Z122 Encounter for screening for malignant neoplasm of respiratory organs: Secondary | ICD-10-CM

## 2023-10-18 DIAGNOSIS — Z87891 Personal history of nicotine dependence: Secondary | ICD-10-CM

## 2023-10-18 DIAGNOSIS — F17200 Nicotine dependence, unspecified, uncomplicated: Secondary | ICD-10-CM

## 2023-10-29 ENCOUNTER — Ambulatory Visit (HOSPITAL_BASED_OUTPATIENT_CLINIC_OR_DEPARTMENT_OTHER)
Admission: RE | Admit: 2023-10-29 | Discharge: 2023-10-29 | Disposition: A | Discharge status: Home / Self Care | Source: Ambulatory Visit | Attending: Family Medicine | Admitting: Family Medicine

## 2023-10-29 DIAGNOSIS — F17200 Nicotine dependence, unspecified, uncomplicated: Secondary | ICD-10-CM | POA: Diagnosis not present

## 2023-10-29 DIAGNOSIS — Z122 Encounter for screening for malignant neoplasm of respiratory organs: Secondary | ICD-10-CM | POA: Insufficient documentation

## 2023-10-29 DIAGNOSIS — Z87891 Personal history of nicotine dependence: Secondary | ICD-10-CM | POA: Diagnosis not present

## 2023-10-29 DIAGNOSIS — F17210 Nicotine dependence, cigarettes, uncomplicated: Secondary | ICD-10-CM | POA: Diagnosis not present

## 2023-11-08 ENCOUNTER — Other Ambulatory Visit: Payer: Self-pay

## 2023-11-08 DIAGNOSIS — Z122 Encounter for screening for malignant neoplasm of respiratory organs: Secondary | ICD-10-CM

## 2023-11-08 DIAGNOSIS — Z87891 Personal history of nicotine dependence: Secondary | ICD-10-CM

## 2023-11-08 DIAGNOSIS — F17210 Nicotine dependence, cigarettes, uncomplicated: Secondary | ICD-10-CM

## 2023-12-20 ENCOUNTER — Other Ambulatory Visit: Payer: Self-pay

## 2023-12-20 DIAGNOSIS — R0602 Shortness of breath: Secondary | ICD-10-CM

## 2023-12-26 ENCOUNTER — Ambulatory Visit (INDEPENDENT_AMBULATORY_CARE_PROVIDER_SITE_OTHER): Admitting: Pulmonary Disease

## 2023-12-26 ENCOUNTER — Ambulatory Visit: Admitting: Pulmonary Disease

## 2023-12-26 VITALS — BP 131/89 | HR 95 | Ht 65.5 in | Wt 206.0 lb

## 2023-12-26 DIAGNOSIS — R0602 Shortness of breath: Secondary | ICD-10-CM

## 2023-12-26 DIAGNOSIS — G4733 Obstructive sleep apnea (adult) (pediatric): Secondary | ICD-10-CM | POA: Diagnosis not present

## 2023-12-26 DIAGNOSIS — J432 Centrilobular emphysema: Secondary | ICD-10-CM | POA: Diagnosis not present

## 2023-12-26 LAB — PULMONARY FUNCTION TEST
DL/VA % pred: 125 %
DL/VA: 5.41 ml/min/mmHg/L
DLCO unc % pred: 100 %
DLCO unc: 24.05 ml/min/mmHg
FEF 25-75 Post: 2.4 L/s
FEF 25-75 Pre: 1.59 L/s
FEF2575-%Change-Post: 51 %
FEF2575-%Pred-Post: 93 %
FEF2575-%Pred-Pre: 61 %
FEV1-%Change-Post: 8 %
FEV1-%Pred-Post: 83 %
FEV1-%Pred-Pre: 77 %
FEV1-Post: 2.56 L
FEV1-Pre: 2.36 L
FEV1FVC-%Change-Post: 3 %
FEV1FVC-%Pred-Pre: 96 %
FEV6-%Change-Post: 5 %
FEV6-%Pred-Post: 87 %
FEV6-%Pred-Pre: 82 %
FEV6-Post: 3.34 L
FEV6-Pre: 3.18 L
FEV6FVC-%Change-Post: 0 %
FEV6FVC-%Pred-Post: 103 %
FEV6FVC-%Pred-Pre: 103 %
FVC-%Change-Post: 5 %
FVC-%Pred-Post: 84 %
FVC-%Pred-Pre: 79 %
FVC-Post: 3.4 L
FVC-Pre: 3.23 L
Post FEV1/FVC ratio: 75 %
Post FEV6/FVC ratio: 99 %
Pre FEV1/FVC ratio: 73 %
Pre FEV6/FVC Ratio: 98 %
RV % pred: 104 %
RV: 2.09 L
TLC % pred: 90 %
TLC: 5.5 L

## 2023-12-26 MED ORDER — ALBUTEROL SULFATE HFA 108 (90 BASE) MCG/ACT IN AERS: 2.0000 | INHALATION_SPRAY | Freq: Four times a day (QID) | RESPIRATORY_TRACT | 2 refills | Status: AC | PRN

## 2023-12-26 NOTE — Patient Instructions (Signed)
 Full pft performed today

## 2023-12-26 NOTE — Progress Notes (Signed)
 Full pft performed today

## 2023-12-26 NOTE — Patient Instructions (Signed)
 Continue albuterol  as needed - Will make sure you have refills  Continue stay active  Continue CPAP for sleep apnea - Download from your machine shows it is working well  Follow-up in about 6 months  Call us  with significant concerns

## 2023-12-26 NOTE — Progress Notes (Signed)
 Stephen Sims    [id number]    1965-07-28  Primary Care Physician:Patient, No Pcp Per  Referring Physician: Willo Mini, NP [address] [place],  [place] [address]  Chief complaint:   Follow-up for obstructive sleep apnea, obstructive lung disease  HPI:  History of obstructive sleep apnea has been on CPAP Tolerate CPAP well - Sleeps well wakes up feeling rested Functioning well, no significant sleepiness during the day  History of emphysema -Was on Stiolto - Has not been using it recently - Using albuterol  only about once or twice a week  -Does not feel limited, stays very active  Recent cardiac catheterization was uneventful  Feels overall well  Does get short of breath occasionally with significant exertion but otherwise does not consider himself Limited  An active smoker   Outpatient Encounter Medications as of 12/26/2023  Medication Sig   amLODipine  (NORVASC ) 5 MG tablet Take 1 tablet (5 mg total) by mouth daily.   aspirin  EC 81 MG tablet Take 1 tablet (81 mg total) by mouth daily. Swallow whole.   atorvastatin  (LIPITOR) 40 MG tablet Take 1 tablet (40 mg total) by mouth daily.   cyanocobalamin (VITAMIN B12) 500 MCG tablet Take 500 mcg by mouth daily.   nitroGLYCERIN  (NITROSTAT ) 0.4 MG SL tablet Place 1 tablet (0.4 mg total) under the tongue every 5 (five) minutes as needed for chest pain.   potassium chloride (KLOR-CON) 10 MEQ tablet Take 10 mEq by mouth daily.   venlafaxine (EFFEXOR) 100 MG tablet Take 100 mg by mouth daily.   [DISCONTINUED] albuterol  (VENTOLIN  HFA) 108 (90 Base) MCG/ACT inhaler Inhale 2 puffs into the lungs every 6 (six) hours as needed for wheezing or shortness of breath.   albuterol  (VENTOLIN  HFA) 108 (90 Base) MCG/ACT inhaler Inhale 2 puffs into the lungs every 6 (six) hours as needed for wheezing or shortness of breath.   Tiotropium Bromide-Olodaterol (STIOLTO RESPIMAT ) 2.5-2.5 MCG/ACT AERS Inhale 2 puffs into the  lungs daily. (Patient not taking: Reported on 12/26/2023)   No facility-administered encounter medications on file as of 12/26/2023.    Allergies as of 12/26/2023 - Review Complete 12/26/2023  Allergen Reaction Noted   Penicillins Other (See Comments)    Codeine Nausea Only    Erythromycin Rash     Past Medical History:  Diagnosis Date   Depression    Sleep apnea     Past Surgical History:  Procedure Laterality Date   LEFT HEART CATH AND CORONARY ANGIOGRAPHY N/A 06/07/2023   Procedure: LEFT HEART CATH AND CORONARY ANGIOGRAPHY;  Surgeon: Swaziland, Peter M, MD;  Location: [facility] INVASIVE CV LAB;  Service: Cardiovascular;  Laterality: N/A;   LEFT HEART CATH AND CORS/GRAFTS ANGIOGRAPHY N/A 06/07/2023   Procedure: LEFT HEART CATH AND CORS/GRAFTS ANGIOGRAPHY;  Surgeon: Swaziland, Peter M, MD;  Location: [facility] INVASIVE CV LAB;  Service: Cardiovascular;  Laterality: N/A;   TONSILLECTOMY     WISDOM TOOTH EXTRACTION      Family History  Problem Relation Age of Onset   Emphysema Father    Heart disease Father    COPD Father    Atrial fibrillation Father    Atrial fibrillation Mother     Social History   Socioeconomic History   Marital status: Married    Spouse name: Not on file   Number of children: Not on file   Years of education: Not on file   Highest education level: Not on file  Occupational History   Occupation: Merchandiser, retail (Maintenance)  Tobacco Use   Smoking status: Every Day    Current packs/day: 1.50    Average packs/day: 1.5 packs/day for 35.0 years (52.5 ttl pk-yrs)    Types: Cigarettes   Smokeless tobacco: Never   Tobacco comments:    1 pack per day.BT CMA   Substance and Sexual Activity   Alcohol use: Yes    Alcohol/week: 0.0 standard drinks of alcohol    Comment: 6pk per month   Drug use: No   Sexual activity: Not on file  Other Topics Concern   Not on file  Social History Narrative   Not on file   Social Drivers of Health   Financial Resource Strain: Not on file   Food Insecurity: Not on file  Transportation Needs: Not on file  Physical Activity: Not on file  Stress: Not on file  Social Connections: Not on file  Intimate Partner Violence: Not on file    Review of Systems  Respiratory:  Positive for apnea. Negative for shortness of breath.   Psychiatric/Behavioral:  Positive for sleep disturbance.     Vitals:   12/26/23 1443  BP: 131/89  Pulse: 95  SpO2: 97%     Physical Exam Constitutional:      Appearance: He is obese.  HENT:     Head: Normocephalic.     Nose: Nose normal.     Mouth/Throat:     Mouth: Mucous membranes are moist.  Eyes:     General: No scleral icterus. Cardiovascular:     Rate and Rhythm: Normal rate and regular rhythm.     Heart sounds: No murmur heard.    No friction rub.  Pulmonary:     Effort: No respiratory distress.     Breath sounds: No stridor. No wheezing or rhonchi.  Musculoskeletal:     Cervical back: No rigidity or tenderness.  Neurological:     Mental Status: He is alert.  Psychiatric:        Mood and Affect: Mood normal.    Data Reviewed: PFT was reviewed with the patient with no significant obstruction, no significant bronchodilator response, no restriction, normal diffusing capacity, there was reduction in FEF 25-75 with a good response of bronchodilators.  Compliance data reviewed showing 100% compliance with CPAP average use of 8 hours 13 minutes AutoSet 12-20 with EPR of 3 95 percentile pressure of 18 with residual AHI of 1.6  Assessment/Plan: History of obstructive lung disease  Centrilobular emphysema - Symptoms are currently well-controlled - Has not been using Stiolto  Uses albuterol  only about once or twice a week  Feels he is functioning well with no significant limitation  Active smoker - Cessation counseling  History of obstructive sleep apnea Compliance looks good - Continues to benefit from CPAP  Encouraged to stay active  Weight loss efforts as  able  Follow-up in about 6 months  Encouraged to call with significant concerns  Refills for albuterol  sent to pharmacy for him  Jennet Epley MD [facility] 12/26/2023, 3:05 PM  CC: Willo Mini, NP
